# Patient Record
Sex: Male | Born: 1940
Health system: Southern US, Community
[De-identification: ages and names within clinical notes are randomized; demographics above are authoritative.]

## PROBLEM LIST (undated history)

## (undated) DIAGNOSIS — I429 Cardiomyopathy, unspecified: Secondary | ICD-10-CM

## (undated) DIAGNOSIS — I219 Acute myocardial infarction, unspecified: Secondary | ICD-10-CM

## (undated) DIAGNOSIS — I82409 Acute embolism and thrombosis of unspecified deep veins of unspecified lower extremity: Secondary | ICD-10-CM

## (undated) DIAGNOSIS — I2510 Atherosclerotic heart disease of native coronary artery without angina pectoris: Secondary | ICD-10-CM

## (undated) DIAGNOSIS — C8190 Hodgkin lymphoma, unspecified, unspecified site: Secondary | ICD-10-CM

## (undated) DIAGNOSIS — R001 Bradycardia, unspecified: Secondary | ICD-10-CM

## (undated) DIAGNOSIS — E785 Hyperlipidemia, unspecified: Secondary | ICD-10-CM

## (undated) HISTORY — DX: Hodgkin lymphoma, unspecified, unspecified site: C81.90

## (undated) HISTORY — PX: OTHER SURGICAL HISTORY: SHX169

## (undated) HISTORY — DX: Acute myocardial infarction, unspecified: I21.9

## (undated) HISTORY — DX: Cardiomyopathy, unspecified: I42.9

## (undated) HISTORY — DX: Atherosclerotic heart disease of native coronary artery without angina pectoris: I25.10

## (undated) HISTORY — DX: Hyperlipidemia, unspecified: E78.5

## (undated) HISTORY — DX: Acute embolism and thrombosis of unspecified deep veins of unspecified lower extremity: I82.409

## (undated) HISTORY — DX: Bradycardia, unspecified: R00.1

---

## 2004-12-21 DIAGNOSIS — I219 Acute myocardial infarction, unspecified: Secondary | ICD-10-CM

## 2004-12-21 HISTORY — DX: Acute myocardial infarction, unspecified: I21.9

## 2005-12-21 DIAGNOSIS — C8190 Hodgkin lymphoma, unspecified, unspecified site: Secondary | ICD-10-CM

## 2005-12-21 HISTORY — DX: Hodgkin lymphoma, unspecified, unspecified site: C81.90

## 2006-10-20 ENCOUNTER — Ambulatory Visit: Payer: Self-pay | Admitting: Cardiology

## 2006-11-24 ENCOUNTER — Ambulatory Visit: Payer: Self-pay | Admitting: Cardiology

## 2007-12-22 HISTORY — PX: CORONARY ANGIOPLASTY WITH STENT PLACEMENT: SHX49

## 2008-02-01 ENCOUNTER — Ambulatory Visit (HOSPITAL_COMMUNITY): Admission: RE | Admit: 2008-02-01 | Discharge: 2008-02-01 | Payer: Self-pay | Admitting: Internal Medicine

## 2008-02-03 ENCOUNTER — Ambulatory Visit: Payer: Self-pay | Admitting: Cardiology

## 2008-02-06 ENCOUNTER — Ambulatory Visit: Payer: Self-pay | Admitting: Cardiology

## 2008-02-07 ENCOUNTER — Ambulatory Visit: Payer: Self-pay | Admitting: Cardiovascular Disease

## 2008-02-07 ENCOUNTER — Inpatient Hospital Stay (HOSPITAL_BASED_OUTPATIENT_CLINIC_OR_DEPARTMENT_OTHER): Admission: RE | Admit: 2008-02-07 | Discharge: 2008-02-07 | Payer: Self-pay | Admitting: Cardiology

## 2008-02-10 ENCOUNTER — Observation Stay (HOSPITAL_COMMUNITY): Admission: RE | Admit: 2008-02-10 | Discharge: 2008-02-11 | Payer: Self-pay | Admitting: Cardiovascular Disease

## 2008-02-10 ENCOUNTER — Ambulatory Visit: Payer: Self-pay | Admitting: Cardiovascular Disease

## 2008-02-28 ENCOUNTER — Ambulatory Visit: Payer: Self-pay | Admitting: Cardiology

## 2008-03-29 ENCOUNTER — Ambulatory Visit: Payer: Self-pay | Admitting: Cardiology

## 2008-04-09 ENCOUNTER — Ambulatory Visit: Payer: Self-pay | Admitting: Cardiology

## 2008-05-09 ENCOUNTER — Ambulatory Visit: Payer: Self-pay | Admitting: Cardiology

## 2008-06-13 ENCOUNTER — Ambulatory Visit (HOSPITAL_COMMUNITY): Admission: RE | Admit: 2008-06-13 | Discharge: 2008-06-13 | Payer: Self-pay | Admitting: Internal Medicine

## 2008-06-14 ENCOUNTER — Encounter: Payer: Self-pay | Admitting: Cardiology

## 2008-07-12 ENCOUNTER — Ambulatory Visit: Payer: Self-pay | Admitting: Cardiology

## 2008-11-20 ENCOUNTER — Encounter: Payer: Self-pay | Admitting: Cardiology

## 2009-02-04 ENCOUNTER — Encounter: Payer: Self-pay | Admitting: Cardiology

## 2009-02-06 ENCOUNTER — Ambulatory Visit: Payer: Self-pay | Admitting: Cardiology

## 2009-02-06 DIAGNOSIS — E782 Mixed hyperlipidemia: Secondary | ICD-10-CM | POA: Insufficient documentation

## 2009-02-06 DIAGNOSIS — I2510 Atherosclerotic heart disease of native coronary artery without angina pectoris: Secondary | ICD-10-CM | POA: Insufficient documentation

## 2009-06-04 ENCOUNTER — Encounter: Payer: Self-pay | Admitting: Cardiology

## 2009-06-05 ENCOUNTER — Encounter: Payer: Self-pay | Admitting: Cardiology

## 2009-06-06 ENCOUNTER — Encounter: Payer: Self-pay | Admitting: Cardiology

## 2009-06-13 ENCOUNTER — Encounter: Payer: Self-pay | Admitting: Cardiology

## 2009-08-07 ENCOUNTER — Encounter (INDEPENDENT_AMBULATORY_CARE_PROVIDER_SITE_OTHER): Payer: Self-pay | Admitting: *Deleted

## 2009-08-09 ENCOUNTER — Encounter: Payer: Self-pay | Admitting: Physician Assistant

## 2009-08-09 ENCOUNTER — Ambulatory Visit: Payer: Self-pay | Admitting: Cardiology

## 2009-08-09 DIAGNOSIS — R42 Dizziness and giddiness: Secondary | ICD-10-CM | POA: Insufficient documentation

## 2009-08-09 DIAGNOSIS — R609 Edema, unspecified: Secondary | ICD-10-CM | POA: Insufficient documentation

## 2009-09-09 ENCOUNTER — Ambulatory Visit: Payer: Self-pay | Admitting: Cardiology

## 2009-09-09 ENCOUNTER — Encounter (INDEPENDENT_AMBULATORY_CARE_PROVIDER_SITE_OTHER): Payer: Self-pay | Admitting: *Deleted

## 2009-09-09 ENCOUNTER — Encounter: Payer: Self-pay | Admitting: Cardiology

## 2009-09-09 ENCOUNTER — Telehealth (INDEPENDENT_AMBULATORY_CARE_PROVIDER_SITE_OTHER): Payer: Self-pay | Admitting: *Deleted

## 2010-02-24 ENCOUNTER — Ambulatory Visit: Payer: Self-pay | Admitting: Cardiology

## 2010-02-24 DIAGNOSIS — I10 Essential (primary) hypertension: Secondary | ICD-10-CM | POA: Insufficient documentation

## 2010-02-26 ENCOUNTER — Encounter: Payer: Self-pay | Admitting: Cardiology

## 2010-02-27 ENCOUNTER — Encounter (INDEPENDENT_AMBULATORY_CARE_PROVIDER_SITE_OTHER): Payer: Self-pay | Admitting: *Deleted

## 2010-03-25 IMAGING — PT NM PET TUM IMG SKULL BASE T - THIGH
6 series · 25 of 25 positions shown · non-contrast
Comparison: 02/01/2008

CLINICAL DATA: Hodgkin's lymphoma.  Last chemotherapy 06/08/2008 .
Follow-up.

NUCLEAR MEDICINE WHOLE BODY PET/CT
TECHNIQUE: 15.1 mCi F-18 FDG was injected intravenously via the
left antecubital vein.  Full-ring PET imaging was performed from
the skull vertex through the lower extremities 65 minutes after
injection.  CT data was obtained and used for attenuation
correction and anatomic localization only.  (This was not acquired
as a diagnostic CT examination.)
Fasting Blood Glucose:  84

[Series 1: pet ac · axial · 3.3mm · 4.69mm/px · z∈[-873,-3]mm · 6 of 267 slices shown]
[im 1/267]
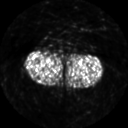
[im 54/267]
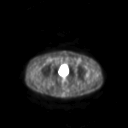
[im 107/267]
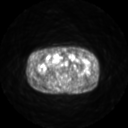
[im 160/267]
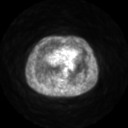
[im 213/267]
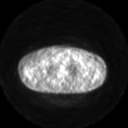
[im 267/267]
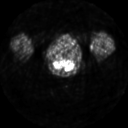

[Series 2: pet nac · axial · 3.3mm · 4.69mm/px · z∈[-873,-3]mm · 6 of 267 slices shown]
[im 1/267]
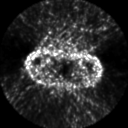
[im 54/267]
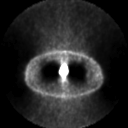
[im 107/267]
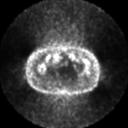
[im 160/267]
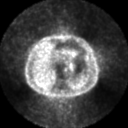
[im 213/267]
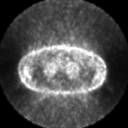
[im 267/267]
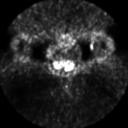

[Series 2: ct images · axial · 3.8mm · 0.98mm/px · z∈[-873,-4]mm · 6 of 267 slices shown]
[im 1/267]
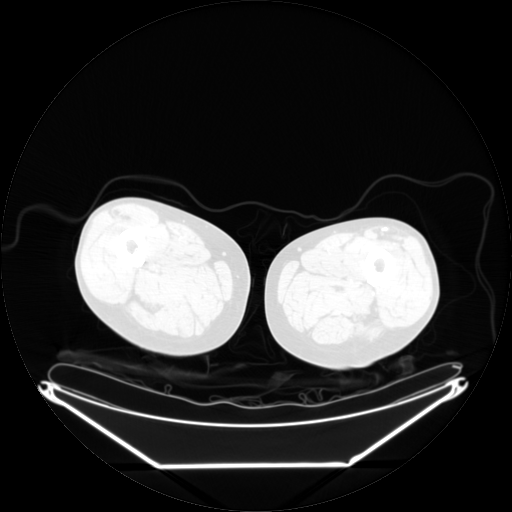
[im 54/267]
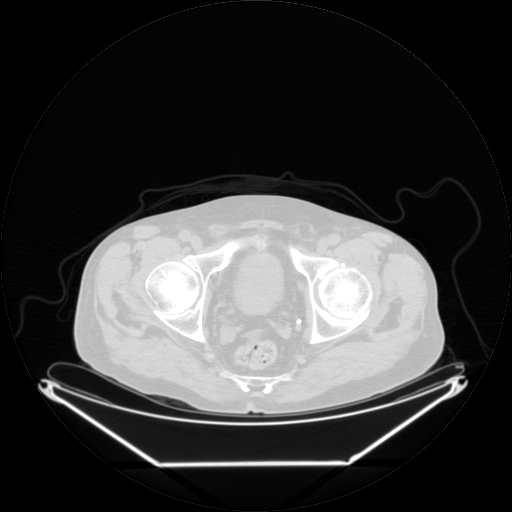
[im 107/267]
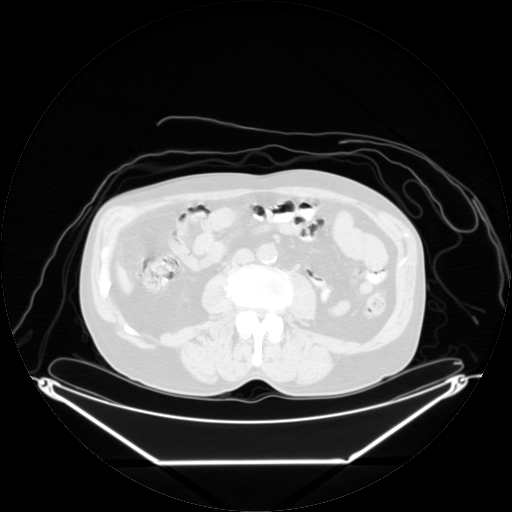
[im 160/267]
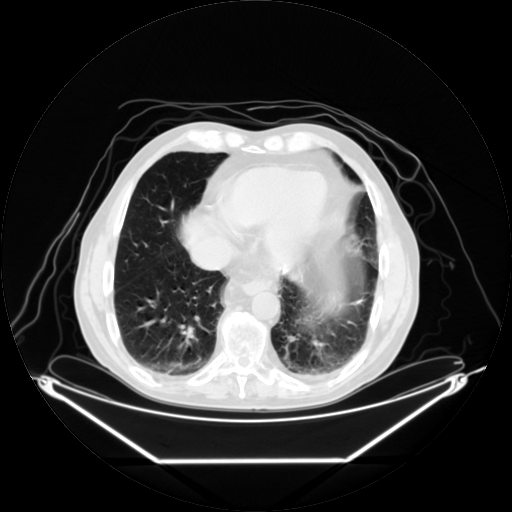
[im 213/267]
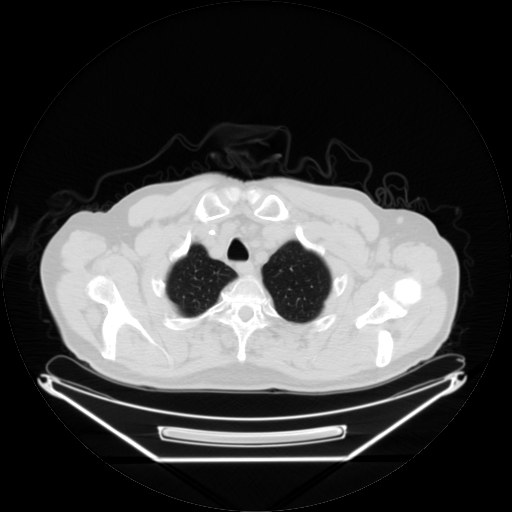
[im 267/267]
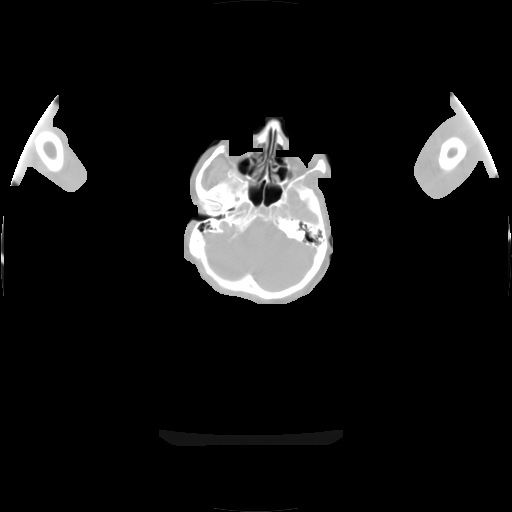

[Series 123: mip · coronal · 3.3mm · 4.69mm/px · 1 of 30 slices shown]
[im 1/30]
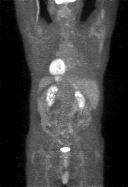

[Series 151: reformatted · axial · 3.3mm · 3.91mm/px · z∈[-762,-33]mm · 5 of 222 slices shown (1 of 2)]
[im 1/222]
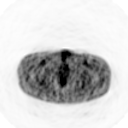
[im 56/222]
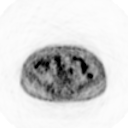
[im 111/222]
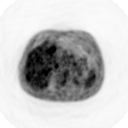
[im 166/222]
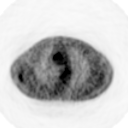
[im 222/222]
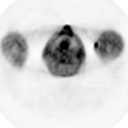

[Series 153: reformatted · coronal · 4.7mm · 6.98mm/px · 1 of 62 slices shown (2 of 2)]
[im 1/62]
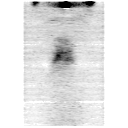

[25 of 25 positions shown; findings below may reference images not displayed]

FINDINGS: Significant interval improvement since prior examination.
For instance, right obturator/external iliac lymph node previously
demonstrated F D G uptake with maximal standard uptake value of
11.5 and now has a maximal standard uptake value of 1.7.  Several
other areas have improved since prior exam..

There are two areas which will require PET surveillance.  This
includes a small area of focal F D G uptake anterior to the
inferior vena cava immediately posterior to the second portion of
the duodenum where there is a tiny area of focal F D G uptake with
maximal standard uptake value of 3.5 but without underlying
discernible lymph node, see image 148.  Additionally, anterior to
the right common iliac artery adjacent to the small bowel loop may
be a small focal area of F D G uptake with maximal standard uptake
value of 4.4, see image 187.  This may represent partial volume
averaging/misregistration with adjacent small bowel uptake.

Subtle increased F D G uptake within the right lobe of thyroid
gland with maximal standard uptake value of 3.  Attention to this
region on follow-up.  No other worrisome areas of abnormal F D G
uptake detected.

Right Mediport catheter is in place and enters the distal left
brachiocephalic vein.

Prominent coronary artery calcifications.  Mild thoracic aortic
calcifications.  Mild abdominal aortic calcifications without
aneurysmal dilatation.  Common iliac artery calcifications.
Slightly prominent prostate gland.  Clinical laboratory correlation
recommended.  Under distended rectum with appearance of mild
circumferential thickening.  Scattered colonic diverticula.  Tiny
amount of free fluid in the pelvis.  Etiology indeterminate.  Low
density liver lesions appears similar to prior exam.  Renal
calcifications may be vascular in origin versus nonobstructing
renal calculi.  Small accessory splenic tissue.  Degenerative
changes cervical, thoracic and lumbar spine with spinal stenosis
lower lumbar region multifactorial in origin.  Dependent basilar
atelectatic changes.
IMPRESSION: Marked interval improvement with most areas demonstrating
resolution of previously noted abnormal F D G uptake.  There are
two tiny areas which may demonstrate malignant range F D G uptake
as discussed above however, it is possible both these findings
represent artifact/misregistration with adjacent bowel rather than
uptake within lymph nodes.  Attention to these regions on follow-up
as noted above.

Right Mediport catheters place with the tip entering the distal
left brachiocephalic vein.

Atherosclerotic type changes most prominent involving coronary
arteries.

Small amount of free fluid within the pelvis.  Etiology
indeterminate.

Slightly enlarged prostate gland.  Clinical and laboratory
correlation recommended.

Multifactorial spinal stenosis lower lumbar region.

## 2010-07-13 ENCOUNTER — Encounter: Payer: Self-pay | Admitting: Cardiology

## 2010-07-14 ENCOUNTER — Ambulatory Visit: Payer: Self-pay | Admitting: Cardiology

## 2010-07-14 ENCOUNTER — Encounter (INDEPENDENT_AMBULATORY_CARE_PROVIDER_SITE_OTHER): Payer: Self-pay | Admitting: *Deleted

## 2010-12-03 ENCOUNTER — Encounter: Payer: Self-pay | Admitting: Cardiology

## 2010-12-04 ENCOUNTER — Encounter: Payer: Self-pay | Admitting: Cardiology

## 2010-12-19 ENCOUNTER — Encounter (INDEPENDENT_AMBULATORY_CARE_PROVIDER_SITE_OTHER): Payer: Self-pay | Admitting: *Deleted

## 2010-12-25 ENCOUNTER — Encounter: Payer: Self-pay | Admitting: Cardiology

## 2011-01-05 ENCOUNTER — Encounter (INDEPENDENT_AMBULATORY_CARE_PROVIDER_SITE_OTHER): Payer: Self-pay | Admitting: *Deleted

## 2011-01-08 ENCOUNTER — Encounter: Payer: Self-pay | Admitting: Cardiology

## 2011-01-08 ENCOUNTER — Ambulatory Visit
Admission: RE | Admit: 2011-01-08 | Discharge: 2011-01-08 | Payer: Self-pay | Source: Home / Self Care | Attending: Cardiology | Admitting: Cardiology

## 2011-01-20 NOTE — Letter (Signed)
Summary: Lexiscan or Dobutamine Pharmacist, community at [facility]  [address]   [place], [place] [address]   Phone: [phone]  Fax: [phone]      [facility] Cardiovascular Services  Lexiscan or Dobutamine Cardiolite Strss Test    [place]  Appointment Date:_  Appointment Time:_  Your doctor has ordered a CARDIOLITE STRESS TEST using a medication to stimulate exercise so that you will not have to walk on the treadmill to determine the condition of your heart during stress. If you take blood pressure medication, ask your doctor if you should take it the day of your test. You should not have anything to eat or drink at least 4 hours before your test is scheduled, and no caffeine, including decaffeinated tea and coffee, chocolate, and soft drinks for 24 hours before your test.  You will need to register at the Outpatient/Main Entrance at the hospital 15 minutes before your appointment time. It is a good idea to bring a copy of your order with you. They will direct you to the Diagnostic Imaging (Radiology) Department.  You will be asked to undress from the waist up and given a hospital gown to wear, so dress comfortably from the waist down for example: Sweat pants, shorts, or skirt Rubber soled lace up shoes (tennis shoes)  Plan on about three hours from registration to release from the hospital  You may take all of your medications with water before your test. This test will be done before your office visit in 6 months (around January 2012).

## 2011-01-20 NOTE — Assessment & Plan Note (Signed)
Summary: 4 MOFU   Visit Type:  Follow-up Primary Provider:  Dr. Lia Hopping   History of Present Illness: 70 year old male presents for followup. He is here with his wife. He reports a recent "sinus infection." No interval problems with angina or unusual breathlessness. He reports compliance with medications.  Followup labs from March showed AST 22, ALT 24, cholesterol 108, triglycerides 70, HDL 38, LDL 56. We reviewed these.  Last ischemic workup was in 2009 as detailed below. We discussed followup surveillance testing prior to his next visit.  Preventive Screening-Counseling & Management  Alcohol-Tobacco     Smoking Status: quit     Year Quit: 1991  Current Medications (verified): 1)  Carvedilol 6.25 Mg Tabs (Carvedilol) .... Take One Tablet By Mouth Twice A Day 2)  Aspirin 81 Mg Tbec (Aspirin) .... Take One Tablet By Mouth Daily 3)  Simvastatin 40 Mg Tabs (Simvastatin) .... Take One Tablet By Mouth Daily At Bedtime 4)  Nitrostat 0.4 Mg Subl (Nitroglycerin) .... Place 1 Tablet Under Tongue As Directed 5)  Azithromycin 250 Mg Tabs (Azithromycin) .... Use As Directed 6)  Hydromet 5-1.5 Mg/69ml Syrp (Hydrocodone-Homatropine) .... Take 5ml By Mouth Every 4-6 Hours As Needed 7)  Mucinex 600 Mg Xr12h-Tab (Guaifenesin) .... Take 1 Tablet By Mouth Two Times A Day  Allergies: 1)  ! Benadryl  Comments:  Nurse/Medical Assistant: The patient's medication bottles and allergies were reviewed with the patient and were updated in the Medication and Allergy Lists.  Past History:  Past Medical History: Last updated: 02/24/2010 CAD - BMS RCA and circ 2/09, residual 75% LAD, LVEF 45% Hyperlipidemia Myocardial Infarction - out of hospital IMI Hodgkin's lymphoma Sinus bradycardia Left upper extremity deep venous thrombosis  Past Surgical History: Last updated: 02/06/2009 Port-A-Cath placement  Social History: Last updated: 02/24/2010 Married  Tobacco Use - Former Alcohol Use -  no  Review of Systems  The patient denies anorexia, fever, chest pain, syncope, dyspnea on exertion, peripheral edema, melena, and hematochezia.         Otherwise reviewed and negative except as outlined.  Vital Signs:  Patient profile:   70 year old male Height:      71 inches Weight:      183 pounds BMI:     25.62 Pulse rate:   57 / minute BP sitting:   125 / 76  (left arm) Cuff size:   regular  Vitals Entered By: Carlye Grippe (July 14, 2010 2:16 PM)  Nutrition Counseling: Patient's BMI is greater than 25 and therefore counseled on weight management options.  Physical Exam  Additional Exam:  Overweight male in no acute distress. HEENT: Conjunctiva and lids normal, oropharynx clear. Neck: Supple, no elevated jugular venous pressure or bruits. Lungs: Clear to auscultation, nonlabored, diminished breath sounds. Cardiac: Regular rate and rhythm, no S3. Abdomen: Soft, nontender, no bruits. Skin: Warm and dry. Extremities: No pitting edema, distal pulses one plus. Musculoskeletal: No gross deformities. Neuropsychiatric: Alert and oriented x3, affect appropriate.   Nuclear Study  Procedure date:  04/09/2008  Findings:      Adenosine/exercise Cardiolite without diagnostic ST segment changes. Inferior wall scar without residual ischemia, LVEF 53%.  Echocardiogram  Procedure date:  02/03/2008  Findings:      LVEF 40-45%, inferolateral hypokinesis, mild left atrial enlargement, mild mitral regurgitation, trace tricuspid regurgitation.  Cardiac Cath  Procedure date:  02/07/2008  Findings:      FINDINGS:  Aortic pressure 109/57 with a mean of 80, left ventricular   pressure  106/90.      CORONARY ANATOMY:   1. 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  The LAD is normal in its most proximal       segment.  There is a large first diagonal branch that supplies       multiple branches to the anterolateral wall.  In the  mid-LAD just       beyond the first diagonal branch there is an area of 80% stenosis.       There is a smaller second diagonal branch that arises from this       area.  After the stenotic lesion, the LAD has nonobstructive plaque       throughout its remaining course.  The LAD courses around the LV       apex.   2. 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  The left circumflex is a relatively small vessel.       It courses down and has a 90% lesion in in the proximal-to-       midportion of the vessel.  It there is no other significant disease       in the left circumflex.   3. The right coronary artery is subtotally occluded in the midportion.       There is a 99% stenosis in that area.  The distal branches of the       right coronary artery, including the posterolateral branches and       PDA branch, fill both via antegrade flow as well as left-to-right       collaterals.  There is no other significant stenosis throughout the       right coronary artery.   4. Left ventricular function.  The basal and mid inferior wall are       dyskinetic.  The remaining portions of the LV are normal.  The LVEF       is 45%.  There is no significant mitral regurgitation.   Impression & Recommendations:  Problem # 1:  CORONARY ATHEROSCLEROSIS NATIVE CORONARY ARTERY (ICD-414.01)  Symptomatically stable on medical therapy. A followup Lexiscan Cardiolite with low-level ambulation will be obtained on medical therapy prior to his next visit for ischemic surveillance, particularly to followup on his LAD disease. No medication changes made today.  His updated medication list for this problem includes:    Carvedilol 6.25 Mg Tabs (Carvedilol) .Marland Kitchen... Take one tablet by mouth twice a day    Aspirin 81 Mg Tbec (Aspirin) .Marland Kitchen... Take one tablet by mouth daily    Nitrostat 0.4 Mg Subl (Nitroglycerin) .Marland Kitchen... Place 1 tablet under tongue as directed  Problem # 2:  MIXED  HYPERLIPIDEMIA (ICD-272.2)  LDL well controlled as of last check in March. Followup lipid profile and liver function tests prior to next visit.  His updated medication list for this problem includes:    Simvastatin 40 Mg Tabs (Simvastatin) .Marland Kitchen... Take one tablet by mouth daily at bedtime  Problem # 3:  DIZZINESS (ICD-780.4)  Resolved.  Patient Instructions: 1)  Your physician has requested that you have an Best boy.  For further information please visit [email or web address]  Please follow instruction sheet, as given. DO THIS TEST BEFORE NEXT OFFICE VISIT IN 6 MONTHS. 2)  Your physician recommends that you go to the [facility] for a FASTING lipid profile  and liver function labs:  DO BEFORE NEXT OFFICE VISIT IN 6 MONTHS. Do not eat or drink after midnight. 3)  Your physician wants you to follow-up in: 6 months. You will receive a reminder letter in the mail one-two months in advance. If you don't receive a letter, please call our office to schedule the follow-up appointment.

## 2011-01-20 NOTE — Letter (Signed)
Summary: Engineer, materials at [facility]  [address]   [place], [place] [address]   Phone: [phone]  Fax: [phone]        February 27, 2010 MRN: [id number]    CORNELIUS MARULLO [address] [place], [place]  [address]    Dear Mr. Reha,  Your test ordered by Selena Batten has been reviewed by your physician (or physician assistant) and was found to be normal or stable. Your physician (or physician assistant) felt no changes were needed at this time.  ____ Echocardiogram  ____ Cardiac Stress Test  _X___ Lab Work-Per Dr. Diona Browner, LDL (bad cholesterol) is at goal and liver function looks good-continue same medications  ____ Peripheral vascular study of arms, legs or neck  ____ CT scan or X-ray  ____ Lung or Breathing test  ____ Other:   Thank you.   Cyril Loosen, RN, BSN    Duane Boston, M.D., F.A.C.C. Thressa Sheller, M.D., F.A.C.C. Oneal Grout, M.D., F.A.C.C. Cheree Ditto, M.D., F.A.C.C. Daiva Nakayama, M.D., F.A.C.C. Kenney Houseman, M.D., F.A.C.C. Jeanne Ivan, PA-C

## 2011-01-20 NOTE — Assessment & Plan Note (Signed)
Summary: 6 MO FU PER FEB REMINDER-SRS   Visit Type:  Follow-up Primary Provider:  Dr. Lia Hopping   History of Present Illness: 70 year old male presents for a followup visit. He reports doing reasonably well without any significant angina or progressive shortness of breath. He works 5 days a week, for a few hours a day, packing boxes at a [employer]  Adenosine Cardiolite done and followup back in April 2009 showed no clear evidence of ischemia with inferior wall scar, LVEF 53%.  He is tolerating simvastatin, and is due for a followup lipid profile and liver function tests.  Today we talked about his blood pressure, and the likelihood of needing to further uptitrate medical therapy. He would like to keep an eye on his blood pressure between now and his next visit prior to considering another medication.  Current Medications (verified): 1)  Carvedilol 6.25 Mg Tabs (Carvedilol) .... Take One Tablet By Mouth Twice A Day 2)  Aspirin 81 Mg Tbec (Aspirin) .... Take One Tablet By Mouth Daily 3)  Simvastatin 40 Mg Tabs (Simvastatin) .... Take One Tablet By Mouth Daily At Bedtime 4)  Nitrostat 0.4 Mg Subl (Nitroglycerin) .... Place 1 Tablet Under Tongue As Directed  Allergies (verified): 1)  ! Benadryl  Comments:  Nurse/Medical Assistant: The patient's medications and allergies were reviewed with the patient and were updated in the Medication and Allergy Lists. Bottles reviewed.  Past History:  Social History: Last updated: 02/24/2010 Married  Tobacco Use - Former Alcohol Use - no  Past Medical History: CAD - BMS RCA and circ 2/09, residual 75% LAD, LVEF 45% Hyperlipidemia Myocardial Infarction - out of hospital IMI Hodgkin's lymphoma Sinus bradycardia Left upper extremity deep venous thrombosis   Social History: Married  Tobacco Use - Former Alcohol Use - no  Review of Systems  The patient denies anorexia, fever, weight loss, chest pain, syncope, prolonged cough,  headaches, hemoptysis, melena, and hematochezia.         Otherwise reviewed and negative.  Vital Signs:  Patient profile:   70 year old male Height:      71 inches Weight:      182 pounds Pulse rate:   54 / minute BP sitting:   144 / 80  (right arm) Cuff size:   large  Vitals Entered By: Carlye Grippe (February 24, 2010 2:03 PM)  Physical Exam  Additional Exam:  Overweight male in no acute distress. HEENT: Conjunctiva and lids normal, oropharynx clear. Neck: Supple, no elevated jugular venous pressure or bruits. Lungs: Clear to auscultation, nonlabored, diminished breath sounds. Cardiac: Regular rate and rhythm, no S3. Abdomen: Soft, nontender, no bruits. Skin: Warm and dry. Extremities: No pitting edema, distal pulses one plus. Musculoskeletal: No gross deformities. Neuropsychiatric: Alert and oriented x3, affect appropriate.   EKG  Procedure date:  02/24/2010  Findings:      Sinus bradycardia at 51 beats per minute, lead loss in V6, evidence of previous inferior infarct.  Impression & Recommendations:  Problem # 1:  CORONARY ATHEROSCLEROSIS NATIVE CORONARY ARTERY (ICD-414.01)  Symptomatically stable on medical therapy. No active anginal symptoms with activities of daily living and part-time work. Electrocardiogram is stable. Refill for Coreg provided.  The following medications were removed from the medication list:    Nitroglycerin 0.4 Mg Subl (Nitroglycerin) ..... One tablet under tongue every 5 minutes as needed for chest pain---may repeat times three His updated medication list for this problem includes:    Carvedilol 6.25 Mg Tabs (Carvedilol) .Marland Kitchen... Take  one tablet by mouth twice a day    Aspirin 81 Mg Tbec (Aspirin) .Marland Kitchen... Take one tablet by mouth daily    Nitrostat 0.4 Mg Subl (Nitroglycerin) .Marland Kitchen... Place 1 tablet under tongue as directed  Orders: EKG w/ Interpretation (93000)  Problem # 2:  MIXED HYPERLIPIDEMIA (ICD-272.2)  Patient due for a followup fasting  lipid profile and liver function tests. These will be arranged.  The following medications were removed from the medication list:    Lipitor 80 Mg Tabs (Atorvastatin calcium) .Marland Kitchen... Take 1 tablet by mouth at bedtime His updated medication list for this problem includes:    Simvastatin 40 Mg Tabs (Simvastatin) .Marland Kitchen... Take one tablet by mouth daily at bedtime  The following medications were removed from the medication list:    Lipitor 80 Mg Tabs (Atorvastatin calcium) .Marland Kitchen... Take 1 tablet by mouth at bedtime His updated medication list for this problem includes:    Simvastatin 40 Mg Tabs (Simvastatin) .Marland Kitchen... Take one tablet by mouth daily at bedtime  Orders: T-Lipid Profile ([address]) T-Hepatic Function [phone])  Problem # 3:  ESSENTIAL HYPERTENSION, BENIGN (ICD-401.1)  Patient to watch blood pressure readings over the next few months. When he returns we will discuss possibilities for additional medical therapy if blood pressure is not more optimal. An ACE inhibitor would be a good choice.  His updated medication list for this problem includes:    Carvedilol 6.25 Mg Tabs (Carvedilol) .Marland Kitchen... Take one tablet by mouth twice a day    Aspirin 81 Mg Tbec (Aspirin) .Marland Kitchen... Take one tablet by mouth daily  Patient Instructions: 1)  Your physician recommends that you go to the [facility] for a FASTING lipid profile and liver function labs. Do not eat or drink after midnight.  2)  Refills sent to [facility] for Coreg (carvedilol) and Simvastatin. 3)  Your physician wants you to follow-up in: 3-4 months. You will receive a reminder letter in the mail one-two months in advance. If you don't receive a letter, please call our office to schedule the follow-up appointment. Prescriptions: SIMVASTATIN 40 MG TABS (SIMVASTATIN) Take one tablet by mouth daily at bedtime  #30 x 6   Entered by:   Cyril Loosen, RN, BSN   Authorized by:   Loreli Slot, MD, [facility]   Signed by:   Cyril Loosen, RN,  BSN on 02/24/2010   Method used:   Electronically to        [facility] (retail)       [address]       [place], [place]  [address]       Ph: [phone]       Fax: [phone]   RxID:   [id number] CARVEDILOL 6.25 MG TABS (CARVEDILOL) Take one tablet by mouth twice a day  #60 x 6   Entered by:   Cyril Loosen, RN, BSN   Authorized by:   Loreli Slot, MD, [facility]   Signed by:   Cyril Loosen, RN, BSN on 02/24/2010   Method used:   Electronically to        [facility] (retail)       [address]       [place], [place]  [address]       Ph: [phone]       Fax: [phone]   RxID:   [id number]

## 2011-01-22 NOTE — Miscellaneous (Signed)
Summary: Orders Update  Clinical Lists Changes  Orders: Added new Test order of T-Lipid Profile ([id number]) - Signed Added new Test order of T-Hepatic Function ([id number]) - Signed 

## 2011-01-22 NOTE — Letter (Signed)
Summary: External Correspondence/ [facility]  External Correspondence/ [facility]   Imported By: Dorise Hiss 01/08/2011 11:10:29  _____________________________________________________________________  External Attachment:    Type:   Image     Comment:   External Document

## 2011-01-22 NOTE — Assessment & Plan Note (Signed)
Summary: 6 mo fu per jan reminder   Visit Type:  Follow-up Primary Provider:  Dr. Lia Hopping   History of Present Illness: 70 year old Danny [name] presents for followup.  He continues to follow with oncology, and had a followup CT examination in December that showed no evidence of recurrent lymphoma. Lab work at that time showed BUN 19, creatinine 0.9, AST 24, ALT Danny, sodium 140, potassium 4.0, hemoglobin 15.0, platelets 182. Recent lipid profile showed cholesterol 111, HDL 44, LDL 54, triglycerides 63.  Followup ischemic testing was discussed at the last visit, not completed as yet. He is actually most comfortable with observation on medical therapy at this point. No active angina, no nitroglycerin use. Reports compliance with medications otherwise. He and his wife both continue to work.  Preventive Screening-Counseling & Management  Alcohol-Tobacco     Smoking Status: quit     Year Quit: 19 yrs  Current Medications (verified): 1)  Carvedilol 6.25 Mg Tabs (Carvedilol) .... Take One Tablet By Mouth Twice A Day 2)  Aspirin 81 Mg Tbec (Aspirin) .... Take One Tablet By Mouth Daily 3)  Simvastatin 40 Mg Tabs (Simvastatin) .... Take One Tablet By Mouth Daily At Bedtime 4)  Nitrostat 0.4 Mg Subl (Nitroglycerin) .... Place 1 Tablet Under Tongue As Directed  Allergies (verified): 1)  ! Benadryl  Past History:  Social History: Last updated: 02/24/2010 Married  Tobacco Use - Former Alcohol Use - no  Past Medical History: CAD - BMS RCA and circ 2/09, residual 75% LAD, LVEF 45% Hyperlipidemia Myocardial Infarction - out of hospital IMI Hodgkin's lymphoma, stage IIIB Sinus bradycardia Left upper extremity deep venous thrombosis  Review of Systems  The patient denies anorexia, fever, chest pain, syncope, dyspnea on exertion, peripheral edema, melena, and hematochezia.         Otherwise reviewed and negative.  Vital Signs:  Patient profile:   70 year old Danny [name] Height:      71  inches Weight:      184.25 pounds Pulse rate:   51 / minute BP sitting:   141 / 76  (left arm) Cuff size:   regular  Vitals Entered By: Hoover Brunette, LPN (January 08, 2011 2:28 PM) Is Patient Diabetic? No Comments 6 month f/u   Physical Exam  Additional Exam:  Overweight Danny [name] in no acute distress. HEENT: Conjunctiva and lids normal, oropharynx clear. Neck: Supple, no elevated jugular venous pressure or bruits. Lungs: Clear to auscultation, nonlabored, diminished breath sounds. Cardiac: Regular rate and rhythm, no S3. Abdomen: Soft, nontender, no bruits. Skin: Warm and dry. Extremities: No pitting edema, distal pulses one plus. Musculoskeletal: No gross deformities. Neuropsychiatric: Alert and oriented x3, affect appropriate.   EKG  Procedure date:  01/08/2011  Findings:      Sinus bradycardia at 49 beats per minute.  Impression & Recommendations:  Problem # 1:  CORONARY ATHEROSCLEROSIS NATIVE CORONARY ARTERY (ICD-414.01)  Symptomatically stable on medical therapy. ECG reviewed. We discussed the matter, and at this point he is most comfortable with observation on medical therapy. No formal ischemic testing at this point. I will see him back in 6 months.  His updated medication list for this problem includes:    Carvedilol 6.25 Mg Tabs (Carvedilol) .Marland Kitchen... Take one tablet by mouth twice a day    Aspirin 81 Mg Tbec (Aspirin) .Marland Kitchen... Take one tablet by mouth daily    Nitrostat 0.4 Mg Subl (Nitroglycerin) .Marland Kitchen... Place 1 tablet under tongue as directed  Orders: EKG w/ Interpretation (93000)  Problem #  2:  ESSENTIAL HYPERTENSION, BENIGN (ICD-401.1)  Blood pressure is somewhat elevated today. We discussed sodium restriction.  His updated medication list for this problem includes:    Carvedilol 6.25 Mg Tabs (Carvedilol) .Marland Kitchen... Take one tablet by mouth twice a day    Aspirin 81 Mg Tbec (Aspirin) .Marland Kitchen... Take one tablet by mouth daily  Orders: EKG w/ Interpretation  (93000)  Problem # 3:  MIXED HYPERLIPIDEMIA (ICD-272.2)  Lipids have been very well controlled, recent LDL 54.  His updated medication list for this problem includes:    Simvastatin 40 Mg Tabs (Simvastatin) .Marland Kitchen... Take one tablet by mouth daily at bedtime  Patient Instructions: 1)  Your physician wants you to follow-up in: 6 months. You will receive a reminder letter in the mail one-two months in advance. If you don't receive a letter, please call our office to schedule the follow-up appointment.  (July) 2)  Your physician recommends that you continue on your current medications as directed. Please refer to the Current Medication list given to you today. Prescriptions: NITROSTAT 0.4 MG SUBL (NITROGLYCERIN) Place 1 tablet under tongue as directed  #25 x 3   Entered by:   Cyril Loosen, RN, BSN   Authorized by:   Loreli Slot, MD, [facility]   Signed by:   Cyril Loosen, RN, BSN on 01/08/2011   Method used:   Electronically to        [facility] (retail)       [address]       [place], [place]  [address]       Ph: [phone]       Fax: [phone]   RxID:   [id number]

## 2011-01-22 NOTE — Letter (Signed)
Summary: Engineer, materials at [facility]  [address]   [place], [place] [address]   Phone: [phone]  Fax: [phone]        January 05, 2011 MRN: [id number]   Danny [name] [address] [place], [place]  [address]   Dear Danny [name],  Your test ordered by Selena Batten has been reviewed by your physician (or physician assistant) and was found to be normal or stable. Your physician (or physician assistant) felt no changes were needed at this time.  ____ Echocardiogram  ____ Cardiac Stress Test  __X__ Lab Work   ____ Peripheral vascular study of arms, legs or neck  ____ CT scan or X-ray  ____ Lung or Breathing test  ____ Other:   Thank you.   Hoover Brunette, LPN    Duane Boston, M.D., F.A.C.C. Thressa Sheller, M.D., F.A.C.C. Oneal Grout, M.D., F.A.C.C. Cheree Ditto, M.D., F.A.C.C. Daiva Nakayama, M.D., F.A.C.C. Kenney Houseman, M.D., F.A.C.C. Jeanne Ivan, PA-C

## 2011-04-01 ENCOUNTER — Other Ambulatory Visit: Payer: Self-pay | Admitting: Cardiology

## 2011-04-01 ENCOUNTER — Other Ambulatory Visit: Payer: Self-pay | Admitting: *Deleted

## 2011-04-01 MED ORDER — SIMVASTATIN 40 MG PO TABS: 40.0000 mg | ORAL_TABLET | Freq: Every evening | ORAL | Status: DC

## 2011-04-01 MED ORDER — CARVEDILOL 6.25 MG PO TABS: 6.2500 mg | ORAL_TABLET | Freq: Two times a day (BID) | ORAL | Status: DC

## 2011-04-02 ENCOUNTER — Other Ambulatory Visit: Payer: Self-pay

## 2011-05-05 NOTE — Assessment & Plan Note (Signed)
[facility]                          [facility] OFFICE NOTE   Danny [name], Danny [name]                      MRN:          [id number]  DATE:03/29/2008                            DOB:          January 05, 1941    ONCOLOGIST:  Dr. Ubaldo Glassing.   PRIMARY CARE PHYSICIAN:  Dr. Olena Leatherwood   REASON FOR VISIT:  Cardiac follow-up.   HISTORY OF PRESENT ILLNESS:  I saw Danny [name] back in March.  His  history is detailed on my previous note.  He continues with chemotherapy  with a diagnosis of Hodgkin's lymphoma.  He has had an occasional  tingling in his chest, but no frank angina.  He otherwise continues on  the medications outlined below.  His electrocardiogram shows sinus  bradycardia with evidence of an inferior infarct pattern which is old.  He has not yet returned to work and was wondering about whether he would  be able to do this later in the month.  He does do some manual labor,  but lifts no more than 50 pounds typically.  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  In addition to this, he had  75% mid left anterior descending stenosis.  He was treated with bare  metal stents to the right coronary artery, as well as anomalous  circumflex and completed a month of Plavix, continuing on aspirin now.  His left anterior descending was treated medically.   ALLERGIES:  NO KNOWN DRUG ALLERGIES.   MEDICATIONS:  1. Aspirin 325 mg p.o. daily.  2. Carvedilol 12.5 mg p.o. b.i.d.  3. Lipitor 80 mg p.o. nightly.  4. He is not on ACE inhibitor therapy given relatively low blood      pressure.   REVIEW OF SYSTEMS:  As described in history of present illness.  No  spontaneous  bleeding or ecchymosis.   PHYSICAL EXAMINATION:  VITAL SIGNS:  Blood pressure today is 131/78,  heart rate is 58, weight 170.8 pounds.  Oxygen saturation is 97% on room  air.  GENERAL:  The patient is comfortable in no acute distress.  HEENT:  Conjunctivae normal.  Oropharynx is clear.  NECK:  Supple.  No elevated jugulovenous pressure.  No loud bruits.  No  thyromegaly.  LUNGS:  Clear without labored breathing.  CARDIAC:  Reveals a regular rate and rhythm.  No S3 gallop or  pericardial rub.  ABDOMEN:  Soft, nontender.  EXTREMITIES:  Exhibit no frank pitting edema.  Distal pulses of 2+.  MUSCULOSKELETAL:  No kyphosis noted.  NEUROPSYCHIATRIC:  The patient is alert and oriented x3.   IMPRESSION/RECOMMENDATIONS:  Known cardiovascular disease as outlined  above with an ejection fraction of approximately 45%, associated with  inferior wall motion abnormality.  He underwent placement of bare metal  stents to the right coronary artery and left  circumflex as outlined and  has a residual moderate stenosis of the left anterior descending.  I  discussed this with him today and in regards to his potential return to  work from a cardiovascular perspective, I think the most reasonable  thing to do would to add Imdur to his present regimen and schedule an  adenosine Cardiolite and follow-up to assess for any residual ischemia.  We can then make a more objective termination of his functional capacity  in anticipation for return to work.  Other than this, he also plans to  discuss his cancer treatment as that is also potentially related to his  ability to return to work.  He will discuss this further with Dr.  Ubaldo Glassing.  Otherwise, I will plan to see him back over the next month.     Danny Sidle, MD  Electronically Signed    SGM/MedQ  DD: 03/29/2008  DT: 03/29/2008  Job #: [id number]   cc:   Danny [name]  Danny [name], M.D.

## 2011-05-05 NOTE — Discharge Summary (Signed)
NAME:  Danny [name], MCKIVER NO.:  [id number]   MEDICAL RECORD NO.:  [id number]          PATIENT TYPE:  INP   LOCATION:  6525                         FACILITY:  [facility]   PHYSICIAN:  Gerrit Friends. Dietrich Pates, MD, FACCDATE OF BIRTH:  [birth year]   DATE OF ADMISSION:  02/10/2008  DATE OF DISCHARGE:  02/11/2008                               DISCHARGE SUMMARY   PROCEDURES:  1. Cardiac catheterization.  2. PTCA and bare-metal stent to two vessels.   PRIMARY FINAL DISCHARGE DIAGNOSIS:  Unstable anginal pain with an out-of-  hospital myocardial infarction.   SECONDARY DIAGNOSES:  1. Ischemic cardiomyopathy with an ejection fraction of 45-50% by      catheterization February 07, 2008.  2. Residual coronary artery disease of 80% in the left anterior      descending artery, to be treated medically.  3. Hodgkin's lymphoma.  4. Status post Port-A-Cath for chemotherapy.  5. Hypertension.  6. Unknown lipid status, profile pending at the time of dictation.  7. Family history of coronary artery disease.  8. Chronic obstructive pulmonary disease.  9. Remote history of tobacco use.  10.History of sinus bradycardia with a heart rate in the 50s,      especially when asleep.   TIME AT DISCHARGE:  35 minutes.   HOSPITAL COURSE:  [name] is a 70 year old male with previous  history of nonobstructive coronary artery disease by catheterization  approximately 10 years ago at [facility].  He went to [facility]  for placement of a Port-A-Cath.  He told the anesthesiologist about  chest pain that had started the night of his lymph node biopsy.  His EKG  had new inferior Q-waves and cardiology was asked to evaluate him.   Because his EKG was abnormal in a new way he was set up for cardiac  catheterization which was performed on February 07, 2008.  The cardiac  catheterization showed a 99% RCA and a 90% anomalous circumflex with an  80% LAD and left-to-right collaterals.  His EF was  45-50%.  Dr. Excell Seltzer  reviewed the films and felt that percutaneous intervention on the RCA  and circumflex was the best option and he was brought back in for this  on February 10, 2008.   [name] had bare-metal stents placed to the RCA and the circumflex,  reducing the RCA stenosis from 99 to zero with TIMI 3 flow and the  anomalous circumflex from 90% to zero with TIMI 3 flow.  Dr. Excell Seltzer felt  that he should be on aspirin and Plavix for at least 30 days with  aggressive lipid-lowering therapy and heart-healthy lifestyle  encouraged.  At this time, medical therapy for the LAD disease is  indicated.  He is cleared to start chemotherapy.   The next day his catheterization site, which had had some oozing  overnight, was without hematoma or bruit.  He had had diarrhea prior to  admission but this had resolved.  His heart rate was stable with sinus  bradycardia occasionally in the 50s, especially when asleep, but nothing  sustained below 50.  His other labs  were within normal limits except for  a white count slightly elevated at 13.1.  His postprocedure CK-MB was  within normal limits at 48/3.1.  He is pending evaluation by Dr.  Dietrich Pates but tentatively considered stable for discharge with outpatient  follow-up arranged.   DISCHARGE INSTRUCTIONS:  1. His activity level is to be increased gradually.  2. He is not to do any lifting for 2 weeks and no driving for 2 days.  3. He is to stick to a low-sodium heart-healthy diet.  4. He is to follow up with Dr. Diona Browner and Tereso Newcomer and a message      has been left with the [facility] office.  5. He is to follow up with Dr. Olena Leatherwood and Dr. Ubaldo Glassing as scheduled.   DISCHARGE MEDICATIONS:  1. Plavix 75 mg daily for at least 30 days.  2. Coated aspirin 325 mg daily.  3. Nitroglycerin sublingual p.r.n.  4. Lipitor 80 mg a day.  5. Norvasc 2.5 mg a day.  6. Coreg 3.125 mg b.i.d.  7. Lisinopril 10 mg a day.  8. Amitriptyline 50 mg daily as at  home.  9. Cimetidine 400 mg daily.  10.Seroquel 100 mg daily.  11.Hydroxyzine 25 mg b.i.d.      Theodore Demark, PA-C      Gerrit Friends. Dietrich Pates, MD, [facility]  Electronically Signed    RB/MEDQ  D:  02/11/2008  T:  02/12/2008  Job:  [id number]   cc:   [facility] [place], [place]  [place]. Darovsky, M.D.

## 2011-05-05 NOTE — Assessment & Plan Note (Signed)
[facility]                          [facility] OFFICE NOTE   ANOTHY, BUFANO                      MRN:          [id number]  DATE:07/12/2008                            DOB:          March 27, 1941    PRIMARY CARDIOLOGIST:  Jonelle Sidle, MD.   REASON FOR VISIT:  Scheduled followup.   Danny [name] returns to our clinic for scheduled followup.  Since last  seen here in May, he continues to do well from a cardiovascular  standpoint with no interim development of signs/symptoms suggestive of  unstable angina pectoris or decompensated heart failure.  He has,  however, since been diagnosed with left upper extremity DVT.  This was  confirmed by an upper extremity duplex scan, ordered by Dr. Ubaldo Glassing,  after Danny [name] presented with a complaint of left neck discomfort.  He states that he had never had any pain until the evening after having  completed his PET scan.  The ultrasound was positive for evidence of  thrombus in both the left jugular veins as well as the mid left  subclavian vein.  Dr. Ubaldo Glassing subsequently started Danny [name] on  Coumadin, and is monitoring and managing this.   With respect to the recent PET scan, Danny [name] informs me that this  was a good report.  Danny [name] is being treated for Hodgkin lymphoma,  diagnosed earlier this year.  He is near completion of a recommended  course of chemotherapy.   CURRENT MEDICATIONS:  Aspirin 325 daily; Lipitor 40 daily; carvedilol  12.5 b.i.d.; Coumadin 5 mg, as directed per Dr. Ubaldo Glassing; Decadron;  bleomycin; vinblastine; and Carbazine.   PHYSICAL EXAMINATION:  VITAL SIGNS:  Blood pressure 118/67, pulse 66 and  regular, and weight 173.8 (up 3).  GENERAL:  A 70 year old male, sitting upright, no distress.  HEENT:  Normocephalic and atraumatic.  NECK:  Palpable carotid pulse without bruits; no JVD.  LUNGS:  Clear to auscultation in all fields.  HEART:  Regular rate and rhythm  (S1S2).  No significant murmurs.  No  rubs or gallops.  ABDOMEN:  Soft, nontender.  EXTREMITIES:  Minimally palpable posterior tibialis pulses with trace up  to 1+ bilateral pedal edema.  Mild venous varicosity noted.  NEURO:  No focal deficit.   IMPRESSION:  1. Multivessel coronary artery disease      a.     Status post out of hospital inferior myocardial infarction,       subsequent bare-metal stenting of subtotal mid right coronary       artery and circumflex arteries.      b.     Residual 75% mid left anterior descending stenosis, treated       medically.      c.     Ejection fraction 45%.      d.     Non-ischemic adenosine Cardiolite; ejection fraction 53%,       April 2009.  2. Hodgkin lymphoma.      a.     Followed by Dr. Ubaldo Glassing.  3. Left upper extremity deep venous thrombosis with thrombus.  a.     On Coumadin, followed by Dr. Ubaldo Glassing.  4. Dyslipidemia.  5. Mild, chronic lower extremity edema.      a.     Suspect secondary to chronic venous insufficiency.  6. Chronic obstructive pulmonary disease/remote tobacco.  7. History of sinus bradycardia.   PLAN:  1. Down titrate aspirin to 81 mg daily, in light of recent addition of      Coumadin.  The patient should then remain on this low dose of      aspirin indefinitely.  2. Followup FLP/LFTs next month.  This is in light of recent down      titration of Lipitor from 80 to 40, following review of most recent      lipid profile this past May, which yielded an LDL of 39 and a      cholesterol of 90.  3. Schedule return clinic followup with myself and Dr. Diona Browner in 6      months.      Rozell Searing, PA-C  Electronically Signed      Jonelle Sidle, MD  Electronically Signed   GS/MedQ  DD: 07/12/2008  DT: 07/13/2008  Job #: [id number]   cc:   Lebron Conners. Darovsky, M.D.  Lia Hopping

## 2011-05-05 NOTE — Assessment & Plan Note (Signed)
[facility]                          [facility] OFFICE NOTE   CLARKE, PERETZ                      MRN:          [id number]  DATE:05/09/2008                            DOB:          28-Jun-1941    PRIMARY CARE PHYSICIAN:  Dr. Olena Leatherwood.   ONCOLOGIST:  Dr. Ubaldo Glassing.   REASON FOR VISIT:  Cardiac followup.   HISTORY OF PRESENT ILLNESS:  Mr. Veley is a very pleasant 70 year old  gentleman I last saw in the office back in April.  He has known  cardiovascular disease status post inferior wall myocardial infarction  with an ejection fraction of 45%, status post bare metal stent placement  to both the right coronary artery and an anomalous circumflex with  moderate left anterior descending disease.  This is being managed  medically.  I referred him for a followup Myoview on medical therapy  which was performed back in April.  This study demonstrated evidence of  inferior wall scar without frank residual ischemia and overall ejection  fraction of 53%.  Overall, he is doing fairly well.  He is back to work  approximately 4 hours at a time and also doing some yard work.  He  continues undergoing regular chemotherapy under the direction of Dr.  Ubaldo Glassing.  His blood pressure has trending up some, although we have not  yet started an ACE inhibitor.  In the past, he was having trouble with  relative hypotension.  He otherwise has been tolerating his medications  well.  He did not start the Imdur, which we discussed last time,  although frankly he is not reporting any major limiting angina and I  told him he could hold off for the time being.   ALLERGIES:  No known drug allergies.   PRESENT MEDICATIONS:  1. Aspirin 325 mg p.o. daily.  2. Lipitor 80 mg p.o. nightly.  3. Carvedilol 12.5 mg p.o. b.i.d.  4. Sublingual 0.4 mg p.r.n.   REVIEW OF SYSTEMS:  As descried in the history of present illness.  Otherwise negative.   EXAMINATION:  Blood pressure  122/67, heart rate is 65, weight is 170.8  pounds.  The patient is comfortable in no acute distress.  Examination neck reveals no elevated jugular venous pressure no loud  bruits.  Lungs are clear without labored breathing.  CARDIAC:  Exam was a regular rate and rhythm.  No loud murmur or gallop.  EXTREMITIES:  Exhibit no pitting edema.   IMPRESSION/RECOMMENDATIONS:  Coronary artery disease with an ejection  fraction of 45-50% status post previous inferior wall myocardial  infarction status post bare metal stent placement to the right coronary  artery and anomalous circumflex with medically managed moderate left  anterior descending disease.  The patient is overall doing well on  medical therapy.  I told him he could hold off on Imdur for the time  being unless he develops exertional symptoms.  He will continue his  chemotherapy as planned and we will plan to see him back over the next 3  months.  I think if his blood pressure remains consistently in a normal  to mildly elevated range, we might institute a low-dose ACE inhibitor.     Jonelle Sidle, MD  Electronically Signed    SGM/MedQ  DD: 05/09/2008  DT: 05/09/2008  Job #: [id number]   cc:   Lia Hopping  Boris Judie Petit. Darovsky, M.D.

## 2011-05-05 NOTE — Discharge Summary (Signed)
NAME:  Danny [name], Danny [name] NO.:  [id number]   MEDICAL RECORD NO.:  [id number]          PATIENT TYPE:  INP   LOCATION:  6525                         FACILITY:  [facility]   PHYSICIAN:  Gerrit Friends. Dietrich Pates, MD, FACCDATE OF BIRTH:  09/22/1941   DATE OF ADMISSION:  02/10/2008  DATE OF DISCHARGE:  02/11/2008                               DISCHARGE SUMMARY   ADDENDUM:  After evaluating [name], Dr. Dietrich Pates made several  medication changes.  He felt that [name] should be on a higher dose  of Coreg, and, therefore, the Coreg was changed from 3.125 mg b.i.d. to  12.5 mg b.i.d..  He felt that the Norvasc could be discontinued to allow  an increase in the beta blocker.  All other medications are the same.      Theodore Demark, PA-C      Gerrit Friends. Dietrich Pates, MD, [facility]  Electronically Signed    RB/MEDQ  D:  02/11/2008  T:  02/12/2008  Job:  [id number]

## 2011-05-05 NOTE — Assessment & Plan Note (Signed)
[facility]                          [facility] NOTE   Danny [name], Danny [name]                      MRN:          [id number]  DATE:02/28/2008                            DOB:          [birth year]    PRIMARY CARE PHYSICIAN:  Dr. Olena Leatherwood.   ONCOLOGIST:  Dr. Ubaldo Glassing.   REASON FOR VISIT:  Cardiac followup.   HISTORY OF PRESENT ILLNESS:  I last saw Danny [name] back in December  2007.  At that point we were following him with a history of  nonobstructive coronary artery disease and asymptomatic bradycardia.  Within the last month he was diagnosed with Hodgkin's lymphoma and was  referred for evaluation of Port-A-Cath placement in anticipation of  chemotherapy.  It was determined at the time of that evaluation, the  patient had had symptoms suggestive of recent myocardial infarction.  He  was seen in consultation by Dr. Andee Lineman and referred for a diagnostic  cardiac catheterization which revealed multivessel obstructive coronary  artery disease.  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  The patient ultimately underwent placement of 2 bare  metal stents by Dr. Excell Seltzer addressing both the right coronary artery and  left circumflex stenoses with planned Plavix for 30 days in anticipation  of the patient's chemotherapy.  He states that he has had a chemotherapy  session already back on February 26 and is due for blood work with a  repeat session this Friday with Dr. Ubaldo Glassing.  He had no major bleeding  problems.  He does state he had some transient hematochezia recently  associated with constipation and straining but none over the last few  days.  He had no anginal chest pain or progressive breathlessness.  Electrocardiogram shows sinus rhythm with evidence of previous inferior  wall infarct and nonspecific ST-T wave changes.  Today I reviewed his  medications.   ALLERGIES:  No known drug allergies.   MEDICATIONS:  1. Aspirin 325 mg p.o. daily.  2. Plavix 75 mg p.o. daily.  3. Carvedilol 12.5 mg p.o. b.i.d.  4. Lipitor 80 mg p.o. nightly.  5. Sublingual nitroglycerin 0.4 mg p.r.n.   REVIEW OF SYSTEMS:  As in history present illness.  He had some lower  extremity edema, around the ankles.  He has been trying to increase  protein in his diet and he had no problems with orthopnea or PND.   EXAMINATION:  Blood pressure is 128/77, heart rate is 74.  Weight is 167  pounds.  This a thin male overall normally nourished appearing no acute distress.  Conjunctivae lids normal.  Oropharynx clear.  NECK:  Supple.  No elevated jugular venous pressure or loud bruits.  No  thyromegaly was noted.  LUNGS:  Clear without labored breathing at rest.  CARDIAC:  Regular rate and rhythm.  No loud murmur or gallop.  No  pericardial rub.  ABDOMEN:  Obese  and nontender.  Normoactive bowel sounds.  EXTREMITIES:  Showed trace to 1+ edema, around the ankle some venous  stasis.  Distal pulses are 2+.  SKIN:  Warm and dry.  Muscular no kyphosis noted.  Neuropsychiatric the patient is alert x3.  Affect is appropriate   IMPRESSION/RECOMMENDATIONS:  Recently diagnosed obstructive coronary  disease status post apparent out of hospital inferior wall myocardial  infarction with an ejection fraction of 45% and inferior wall akinesis.  The patient is now status post bare metal stent placement to a  subtotally occluded right coronary artery as well as anomalous left  circumflex by Dr. Excell Seltzer over the last month.  He tolerated this well  and has moderate residual disease within the left anterior descending  without active angina.  The plan was for aspirin and Plavix for least 30  days.  I would anticipate he should be  able to stop this medicine over  the next week, particularly in light of his ongoing chemotherapy.  If  his aspirin needs to be held as well, this is also reasonable, although  would try to continue it if possible hematologically.  We will plan to  see him over the next few months and at that time check a follow-up  after the profile, liver function tests on Lipitor.  We talked about  possibly using a diuretic intermittently for his lower extremity edema,  although I am a bit hesitant to do this and potentially precipitate  relative dehydration around the time of chemotherapy.  We will hold off  time being.     Jonelle Sidle, MD  Electronically Signed    SGM/MedQ  DD: 02/28/2008  DT: 02/28/2008  Job #: [id number]   cc:   Lia Hopping  Boris Judie Petit. Darovsky, M.D.

## 2011-05-05 NOTE — Assessment & Plan Note (Signed)
[facility]                          [facility] NOTE   Danny [name], Danny [name]                      MRN:          [id number]  DATE:02/06/2009                            DOB:          01-24-1941    PRIMARY CARE PHYSICIAN:  Dr. Lia [name]   ONCOLOGIST:  Danny Conners. Darovsky, MD   REASON FOR VISIT:  Routine followup.   HISTORY OF PRESENT ILLNESS:  Danny [name] comes back in for a 50-month  visit.  He is doing well and states that at this point his Hodgkin  lymphoma is in remission.  He still has a Port-A-Cath in place and has a  followup scan in about 6 months.  From a cardiac perspective, he is not  reporting any angina.  He has not had to use any nitroglycerin and in  fact needs a refill because his present bottle is old.  He had followup  lipids done recently showing good LDL control at 52 and an HDL of 42  with normal SGPT and SGOT.  He is on statin therapy and tolerating it  well.  I reviewed his medications which have been simplified  significantly since his last visit.  He is no longer on Coumadin having  undergone treatment for left upper extremity deep venous thrombosis by  Dr. Ubaldo [name].  He has occasional episodes of lightheadedness and nausea,  although he has had no syncope.  His heart rate at rest is low on the  present dose of carvedilol.   ALLERGIES:  No known drug allergies.   PRESENT MEDICATIONS:  1. Aspirin 81 mg p.o. daily.  2. Carvedilol 12.5 mg p.o. b.i.d.  3. Lipitor 40 mg p.o. nightly.  4. Sublingual nitroglycerin 0.4 mg p.r.n.   REVIEW OF SYSTEMS:  As described in the history of present illness.  Otherwise, negative.   PHYSICAL EXAMINATION:  VITAL SIGNS:  Blood pressure today is 132/65,  heart rate is 53, weight is 173 pounds.  GENERAL:  The patient is comfortable in no acute distress.  HEENT:  Conjunctiva and lids normal.  Oropharynx clear.  NECK:  Supple.  No elevated jugular venous pressure.  No loud bruits.  No thyromegaly is noted.  LUNGS:  Clear with diminished breath sounds.  CARDIAC:  Regular rate and rhythm.  No S3 gallop or pericardial rub.  ABDOMEN:  Soft, nontender.  EXTREMITIES:  Mild venous stasis, trace edema around the ankles.  MUSCULOSKELETAL:  No kyphosis noted.  NEUROPSYCHIATRIC:  The patient is alert and oriented x3.  Affect is  appropriate   IMPRESSION AND RECOMMENDATIONS:  1. Coronary artery disease status post out of hospital inferior wall      myocardial infarction subsequently managed with bare-metal stent      placement to the mid right coronary artery and circumflex vessels.      Ejection fraction is in the 45-50% range and he does have residual      75% left anterior descending disease that is being managed      medically following a nonischemic Cardiolite.  He is      symptomatically stable.  We  will plan to continue medical therapy      with followup in the next 6 months.  2. Hyperlipidemia, will be managed on present dose of Lipitor.  3. History of Hodgkin lymphoma, reportedly in remission at this point.      This is followed by Dr. Ubaldo [name].  4. Occasional episodes of lightheadedness and nausea, of uncertain      significance.  He is not reporting any chest pain or palpitations      with this.  He does have a low resting heart rate raising the      possibility of intermittent symptomatic bradycardia.  We will plan      to decrease his carvedilol to 6.25 mg p.o. b.i.d. and follow him      expectantly.     Danny Sidle, MD  Electronically Signed    SGM/MedQ  DD: 02/06/2009  DT: 02/07/2009  Job #: [id number]   cc:   Danny [name], M.D.

## 2011-05-05 NOTE — Cardiovascular Report (Signed)
NAME:  Danny [name], Danny [name] NO.:  [id number]   MEDICAL RECORD NO.:  [id number]          PATIENT TYPE:  INP   LOCATION:  2807                         FACILITY:  [facility]   PHYSICIAN:  Veverly Fells. Excell Seltzer, MD  DATE OF BIRTH:  01/18/1941   DATE OF PROCEDURE:  02/10/2008  DATE OF DISCHARGE:                            CARDIAC CATHETERIZATION   PROCEDURE:  PTCA and stenting of the right coronary artery and left  circumflex, Angio-Seal of the right femoral artery.   INDICATIONS:  Danny [name] is a 70 year old gentleman who sustained a  recent inferior wall myocardial infarction out of the hospital.  He  underwent a diagnostic cath earlier this week which was approximately 2  weeks following his clinical event. This demonstrated a subtotally  occluded right coronary artery with a 99% lesion in the mid vessel and  TIMI II flow.  There were collaterals identified from the left coronary  as well as antegrade flow through the right coronary artery.  In  addition he has an anomalous left circumflex that originates from the  proximal right coronary artery that demonstrated a 90-95% stenosis.  He  also had moderate LAD disease of 75% in the mid-LAD just at the origin  of the diagonal branch.  After reviewing his films with both Dr. Riley Kill  and Dr. Juanda Chance, I elected to intervene on the right coronary artery and  left circumflex and treat his LAD with medical therapy.  This is in the  setting of newly diagnosed Hodgkin's lymphoma and need for chemotherapy.  I planned on treating him with bare metal stents so that he will not be  relegated to long-term Plavix.   The risks and indications of the procedure were reviewed with the  patient, informed consent was obtained. The right groin was prepped,  draped, anesthetized with 1% lidocaine. Using the modified Seldinger  technique, a 6-French sheath was placed in the right femoral artery. A 6-  [place] AR-1 guide catheter was used and this fit  well into the vessel  which had a very inferior takeoff. The initial angiography was  performed.  The patient had been preloaded on clopidogrel.  Angiomax was  used for anticoagulation.  Once a therapeutic ACT was achieved, a cougar  guidewire was used to wire the mid-right coronary artery lesion and this  was done with a moderate amount of difficulty.  The lesion was then  predilated with a 2.0 x 15-mm Vision stent which was taken to 10  atmospheres.  Following balloon dilatation, the antegrade flow in the  vessel improved dramatically. The lesion appeared to be approximately 12-  14 mm in length.  It appeared to be 3 mm in diameter and I elected to  treat it with a 3.0 x 16-mm Liberte' stent which was deployed at 16  atmospheres.  The stent was well expanded and there was TIMI III flow.  There appeared to be a step-up into the stent as well as a step-down off  the distal into the stent so I elected not to post dilate initially.  At  that point, attention was turned to  the left circumflex. I initially  attempted to wire the left circumflex with a cougar guidewire but with  multiple attempts I was unable to due to the acute angulation off the  origin of the right coronary artery.  I then changed out to a whisper  guidewire and with some maneuvering of the guide catheter was able to  pass the whisper beyond the area of severe stenosis in the left  circumflex into the distal vessel. This lesion was dilated with a 2.0 x  12-mm Maverick balloon to 8 atmospheres for 60 seconds.  There was  improvement in the stenosis as the balloon was removed.  Intracoronary  nitroglycerin was given.  The vessel appeared to be approximately 2 mm  in diameter and I elected to stent it with a 2.0 x 12 mm multilength  mini Vision stent which was deployed at 14 atmospheres.  The stent  appeared to be well deployed.  There was TIMI III flow after stenting.  I then postdilated the stent with a 2.25 x 8-mm Quantum  Maverick balloon  up to 16 atmospheres.  There was an excellent angiographic result with  TIMI III flow in the vessel.  At that point after reviewing the  appearance of the right coronary artery, the vessel was larger after  nitroglycerin.  I went on to post dilate the stent in the mid-right  coronary artery with a 3.5 x 15-mm Quantum Maverick up to 18  atmospheres.  At the completion of the procedure,  angiographic images  were performed that demonstrated an excellent result in both vessels  with TIMI III flow.  The patient was chest pain free.  The guide  catheter was removed and the femoral arteriotomy was closed with a 6-  French Angio-Seal device.  The patient tolerated the procedure well.   CONCLUSION:  Successful PTCA and stenting of the right coronary artery  and left circumflex with single bare metal stents in each vessel.   PLAN:  Danny [name] should receive aspirin and Plavix in combination for  at least 30 days.  He will continue with aggressive medical therapy for  his residual LAD stenosis. He should be able to start chemotherapy in  the next week.      Veverly Fells. Excell Seltzer, MD  Electronically Signed     MDC/MEDQ  D:  02/10/2008  T:  02/10/2008  Job:  [id number]   cc:   Learta Codding, MD,FACC  Boris M. Darovsky, M.D.  Lia Hopping

## 2011-05-05 NOTE — Cardiovascular Report (Signed)
NAME:  Danny [name], Danny [name] NO.:  [id number]   MEDICAL RECORD NO.:  [id number]           PATIENT TYPE:   LOCATION:                                 FACILITY:   PHYSICIAN:  Veverly Fells. Excell Seltzer, MD  DATE OF BIRTH:  Aug 02, 1941   DATE OF PROCEDURE:  02/07/2008  DATE OF DISCHARGE:                            CARDIAC CATHETERIZATION   PROCEDURES:  1. Left heart catheterization.  2. Selective coronary angiography.  3. Left ventricular angiography.   INDICATIONS:  Mr. Ferrufino is a 70 year old man who previously was  diagnosed with nonobstructive CAD.  He has been more recently diagnosed  with Hodgkin's disease.  He has EKG and echo findings consistent with a  recent inferior MI.  He presented after sustaining an out-of-hospital  MI, and also had mildly elevated cardiac enzymes that were suspected to  be trending downward.  He presents today for cardiac cath to define his  coronary anatomy.  He also has plans for upcoming chemotherapy in the  very near future.   Risks and indications of the procedure were reviewed in detail with the  patient and family.  Informed consent was obtained.  The right groin was  prepped, draped, and anesthetized with 1% lidocaine using a modified  Seldinger technique.  A 4-French sheath was placed in the right femoral  artery.  Standard 4-French Judkins catheters were used for selective  coronary angiography.  An angled pigtail catheter was used for left  ventriculography.  The patient tolerated the procedure well and had no  immediate complications.   FINDINGS:  Aortic pressure 109/57 with a mean of 80, left ventricular  pressure 106/90.   CORONARY ANATOMY:  1. 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  The left      circumflex is anomalous.  The LAD is normal in its most proximal      segment.  There is a large first diagonal branch that supplies      multiple branches to the anterolateral wall.  In the mid-LAD just      beyond the  first diagonal branch there is an area of 80% stenosis.      There is a smaller second diagonal branch that arises from this      area.  After the stenotic lesion, the LAD has nonobstructive plaque      throughout its remaining course.  The LAD courses around the LV      apex.  2. Left circumflex has an anomalous origin off of the proximal right      coronary artery.  The left circumflex is a relatively small vessel.      It courses down and has a 90% lesion in in the proximal-to-      midportion of the vessel.  It there is no other significant disease      in the left circumflex.  3. The right coronary artery is subtotally occluded in the midportion.      There is a 99% stenosis in that area.  The distal branches of the      right coronary  artery, including the posterolateral branches and      PDA branch, fill both via antegrade flow as well as left-to-right      collaterals.  There is no other significant stenosis throughout the      right coronary artery.  4. Left ventricular function.  The basal and mid inferior wall are      dyskinetic.  The remaining portions of the LV are normal.  The LVEF      is 45%.  There is no significant mitral regurgitation.   ASSESSMENT:  1. Subtotal occlusion of the right coronary artery with prior inferior      wall MI and corresponding LV wall motion abnormality.  2. Severe mid-LAD stenosis.  3. Anomalous left circumflex with severe stenosis.  4. Mild left ventricular systolic dysfunction consistent with prior      inferior MI.   PLAN:  I am going to review Mr. Armes's films with colleagues.  This  is a difficult case because he requires immediate chemotherapy for his  Hodgkin's lymphoma.  The RCA is a large vessel that still has antegrade  flow. While he has sustained an inferior MI, the vessel is very large  and the wall motion abnormality is relatively small.  The LAD has  significant stenosis in its proximal-to-midportion as described. The   left circumflex has severe stenosis and is a moderate sized vessel with  an anomalous origin, which technically would be challenging to intervene  upon.  After reviewing his films we will make further recommendations. I  am initially leaning toward treating his RCA with a bare-metal stent so  that he can begin chemotherapy in the near future.      Veverly Fells. Excell Seltzer, MD  Electronically Signed     MDC/MEDQ  D:  02/07/2008  T:  02/08/2008  Job:  [id number]   cc:   Lebron Conners. Darovsky, M.D.  Learta Codding, MD,FACC

## 2011-05-05 NOTE — Assessment & Plan Note (Signed)
[facility]                                 ON-CALL NOTE   KAYSAN, PEIXOTO                      MRN:          [id number]  DATE:02/06/2008                            DOB:          06-Aug-1941    PRIMARY CARDIOLOGIST:  Dr. Andee Lineman in [place].   PRIMARY CARE PHYSICIAN:  Dr. Olena Leatherwood.   HISTORY:  Mr. Palmeri is a 70 year old white male who apparently was  evaluated by [facility] and Dr. Andee Lineman during a recent  hospitalization at [facility] for what sounds like reoccurring  Hodgkin's lymphoma.  During this time the patient states that he was  diagnosed with a heart attack and Dr. Andee Lineman arranged the outpatient  cardiac catheterization which was performed yesterday.  He is supposed  to return Friday for possible stenting. Postprocedure yesterday he was  started on Plavix.  Last evening, he stated that he was awakened with  diarrhea and diffuse sweating.  He did not have any chest discomfort or  symptoms reminiscent of his recent heart attack.  It is not clear as  specifically as to what time this occurred and the patient did not try  taking his temperature yesterday evening.  His wife had given him  sublingual nitroglycerin, but that did not have any effect on his  symptoms.   He called this morning due to concerns.   At this time Mr. Rotert states that he feels fine.  He is not having  any problems.  He also states he has not developed any rashes which is  the predominant reaction one may have with Plavix.   I suggested to Mr. Bernette Mayers his wife that he check his temperature to  make sure that he is not having a febrile illness and that he contact  Dr. Olena Leatherwood later this morning, in regards to his night sweats and  diarrhea.  I asked him to continue taking his medications.  However, if  his symptoms returned, I suggested preceding to the nearest emergency  room.  They were agreeable with this plan.      Joellyn Rued, PA-C  Electronically Signed      Jonelle Sidle, MD  Electronically Signed   EW/MedQ  DD: 02/08/2008  DT: 02/08/2008  Job #: [id number]

## 2011-05-08 NOTE — Assessment & Plan Note (Signed)
[facility]                          [facility]ICE NOTE   KURT, HOFFMEIER                      MRN:          [id number]  DATE:11/24/2006                            DOB:          Oct 31, 1941    PRIMARY CARE PHYSICIAN:  Dr. Lia Hopping   REASON FOR VISIT:  Followup bradycardia.   HISTORY OF PRESENT ILLNESS:  I saw Mr. Sohail back in October. He has a  history of presumed nonobstructive coronary artery disease based on a  cardiac catheterization at [facility] approximately  10 years ago. I await records for review. He had a standard treadmill  test and followup in July of 2006, which was reported as negative for  ischemia. At his last visit, we decreased atenolol to 12.5 mg daily and  his heart rate has increased some. Heart rate at last visit was 46 and  now is up to 52 and he has not reported any problems with exertional  chest pain, limiting dyspnea on exertion, dizziness or syncope.   ALLERGIES:  No known drug allergies.   PRESENT MEDICATIONS:  1. Aspirin 325 mg p.o. daily.  2. Atenolol 12.5 mg p.o. daily.  3. Vitamin E 300 international units p.o. daily.   REVIEW OF SYSTEMS:  As described in History of Present Illness.   EXAMINATION:  Blood pressure is 132/84, heart rate is 52, weight is 176  pounds. The patient is comfortable and in no acute distress.   NECK: Examination reveals no elevated jugular venous pressure, without  bruits. No thyromegaly is noted.  LUNGS:  Are clear without labored breathing.  CARDIAC: Reveals a regular rate and rhythm without loud murmur or  gallop.  EXTREMITIES: Show no pitting edema.   IMPRESSION/RECOMMENDATION:  1. Sinus bradycardia, largely asymptomatic at this time based on      patient's description. Will plan to continue atenolol at 12.5 mg      daily and have him follow up over the next 6 months for symptom      review. If he has any progressive problems in the  interim, he will      let us know.  2. History of presumed nonobstructive coronary artery disease based on      limited information. He did have a negative exercise treadmill test      by Dr. Olena Leatherwood in July of 2006. I await records from [facility] regarding his cardiac catheterization      approximately 10 years ago. He is not reporting any angina at this      time.  3. The patient will continue regular followup with Dr. Olena Leatherwood. If not      done recently, would      suggest a fasting lipid profile. In the setting of coronary artery      disease, his goal LDL cholesterol should be in the 70-80 range.     Jonelle Sidle, MD  Electronically Signed    SGM/MedQ  DD: 11/24/2006  DT: 11/24/2006  Job #: [id number]   cc:   Lia Hopping

## 2011-05-08 NOTE — Assessment & Plan Note (Signed)
[facility]                              CARDIOLOGY OFFICE NOTE   Danny [name], Danny [name]                      MRN:          [id number]  DATE:10/20/2006                            DOB:          [birth year]    REQUESTING PHYSICIAN:  Paulene Floor, M.D.   REASON FOR CONSULTATION:  Bradycardia.   HISTORY OF THE PRESENT ILLNESS:  Danny [name] is a pleasant 70 year old male  with the available history indicating hypertension but no major  cardiovascular disease or known cardiac dysrhythmias.  I do not have  complete information, but he tells me that he underwent a cardiac  catheterization at [facility] approximately nine or 10  years ago that showed only some mild blockages that were managed  medically.  Since then he has had follow up stress testing apparently at  [facility] approximately two to three years ago that was also  reassuring.  He is very active with many chores indoors and outdoors, and is  not limited by any exertional chest pain, persistent fatigue or dyspnea on  exertion beyond the New York Heart Association class II.   The patient has had some problems with a generalized rash recently and saw  Dr. Daphine Deutscher for this. It was noted at that time that the patient was  bradycardic and he was referred here to discuss things further.   Of note, he also had a recent episode of dizziness while he was at work,  this occurred approximately 1-1.5 weeks ago.  He has not had any syncope and  denies any specific palpable.  He had an episode years ago that was similar  where he also felt nauseated and diaphoretic.   The patient's electrocardiogram today showed sinus bradycardia at 46 beats  per minute.  Tracing from October 11, 2006 also showed sinus bradycardia at  49 beats/minute.   ALLERGIES:  No known drug allergies.   MEDICATIONS:  Present medications consists of:  1. Aspirin 325 mg by mouth daily.  2. Atenolol 25 by  mouth daily.  3. Vitamin E 300 units by mouth daily.  4. Hydroxyzine as needed.   PAST MEDICAL HISTORY:  The past medical history is  as outlined in the  history of present illness.   SOCIAL HISTORY:  The patient is married.  He has two children.  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  He denies any recent tobacco  use having quit in 1999.  He has no history of significant alcohol use.  He  drinks five caffeinated beverage a day.   FAMILY HISTORY:  The family history is significant for coronary artery  disease in the patient's brother in his 80s and  history of stroke in the  patient's father in his 54s.   REVIEW OF SYSTEMS:  This  review of systems is a noted in the history of  present illness and is otherwise negative.   PHYSICAL EXAMINATION:  VITAL SIGNS:  Blood pressure 158/72, heart rate 46  and regular, and weight is 175 pounds.  GENERAL APPEARANCE:  The patient is a  normally-nourished, well-developed  male in no acute distress.  HEENT:  Conjunctivae are normal.  Pharynx is clear.  NECK:  The neck is supple with elevated jugular venous pressure, and without  bruits.  No thyromegaly is noted.  LUNGS:  The patient lungs are clear without labored breathing.  HEART:  Cardiac exam reveals a regular rate and rhythm without loud murmur  or S3 gallop.  ABDOMEN:  The abdomen is  soft.  No bruits noted.  Bowel sounds are present.  EXTREMITIES:  The extremities shows no significant pitting edema.  Distal  pulsed are 2+.  SKIN:  The skin reveals no ulcerative changes.  MUSCULOSKELETAL:  No kyphosis noted.   IMPRESSION AND RECOMMENDATIONS:  1. Sinus bradycardia.  It is not entirely clear that the patient's recent      and remote episodes of dizziness were rhythm related, but it is      certainly possible that he has transiently more significant bradycardia      or perhaps even pauses.  He is on atenolol and states that he has been      on this medicine for the  last 10 years.  He otherwise is not      manifesting any exertional symptoms to suggest that coronary artery      disease is clearly related.  We will plan to obtain his catheterization      records from years ago as well as his most recent stress test.  In the      meanwhile I have asked him to decrease his atenolol to 12.5 mg daily.      We will have him follow up in our [facility] as this will be more      convenient for him and to determine whether we need to discontinued      beta blocker therapy altogether and substitute another medication for      blood pressure control.  Since he does not have frequent symptomatology      I am not certain an event recorder would be of much use at this time.      We may have to simply observe him.  If he manifest progressive symptoms      we will consider additional testing. He and his wife are comfortable      with this.  2. Further plans are to follow.     Jonelle Sidle, MD  Electronically Signed    SGM/MedQ  DD: 10/20/2006  DT: 10/20/2006  Job #: [id number]   cc:   Paulene Floor, M.D.  Lia Hopping

## 2011-09-11 LAB — BASIC METABOLIC PANEL
BUN: 14
CO2: 26
Calcium: 10
GFR calc Af Amer: >60
GFR calc non Af Amer: >60
GFR calc non Af Amer: >60
Glucose, Bld: 95
Potassium: 4
Sodium: 140

## 2011-09-11 LAB — CBC
HCT: 41.4
Hemoglobin: 16.2
MCHC: 34.2
MCV: 96.8
Platelets: 297
RBC: 5.02
RDW: 13.6
RDW: 14.3

## 2011-09-11 LAB — CK TOTAL AND CKMB (NOT AT ARMC): CK, MB: 3.1

## 2011-09-11 LAB — APTT: aPTT: 32

## 2011-09-11 LAB — PROTIME-INR: INR: 1

## 2011-09-11 LAB — LIPID PANEL: Triglycerides: 75

## 2011-10-27 ENCOUNTER — Other Ambulatory Visit: Payer: Self-pay | Admitting: Cardiology

## 2011-12-14 ENCOUNTER — Encounter: Payer: Self-pay | Admitting: *Deleted

## 2011-12-24 ENCOUNTER — Ambulatory Visit (INDEPENDENT_AMBULATORY_CARE_PROVIDER_SITE_OTHER): Payer: Private Health Insurance - Indemnity | Admitting: Physician Assistant

## 2011-12-24 ENCOUNTER — Encounter: Payer: Self-pay | Admitting: Cardiology

## 2011-12-24 VITALS — BP 145/73 | HR 54 | Ht 71.0 in | Wt 181.0 lb

## 2011-12-24 DIAGNOSIS — I2510 Atherosclerotic heart disease of native coronary artery without angina pectoris: Secondary | ICD-10-CM

## 2011-12-24 DIAGNOSIS — I498 Other specified cardiac arrhythmias: Secondary | ICD-10-CM

## 2011-12-24 DIAGNOSIS — R55 Syncope and collapse: Secondary | ICD-10-CM

## 2011-12-24 DIAGNOSIS — Z79899 Other long term (current) drug therapy: Secondary | ICD-10-CM

## 2011-12-24 DIAGNOSIS — E782 Mixed hyperlipidemia: Secondary | ICD-10-CM

## 2011-12-24 DIAGNOSIS — R001 Bradycardia, unspecified: Secondary | ICD-10-CM | POA: Insufficient documentation

## 2011-12-24 MED ORDER — NITROGLYCERIN 0.4 MG SL SUBL: 0.4000 mg | SUBLINGUAL_TABLET | SUBLINGUAL | Status: DC | PRN

## 2011-12-24 NOTE — Assessment & Plan Note (Signed)
Asymptomatic; continue current dose of carvedilol.

## 2011-12-24 NOTE — Progress Notes (Signed)
HPI: Patient presents for scheduled followup, since last seen here in clinic, by Dr. Diona Browner, in January 2012.   Although he denies any interim development of exertional CP or DOE, he relates an episode of near-syncope, approximately 2 months ago, while at his job. He experienced "blurred vision", while standing, with no antecedent CP, SOB, diaphoresis, nausea, or palpitations. He had one prior remote episode, and did not seek medical attention in either case. He has not had any recurrent episodes.  He also points out that he has a chronically low resting heart rate (45 to 50s), but denies any associated symptoms.  Allergies  Allergen Reactions  . Diphenhydramine Hcl     REACTION: hyper/aggressive    Current Outpatient Prescriptions  Medication Sig Dispense Refill  . aspirin EC 81 MG tablet Take 81 mg by mouth daily.        . carvedilol (COREG) 6.25 MG tablet TAKE ONE TABLET BY MOUTH TWICE DAILY  60 tablet  5  . nitroGLYCERIN (NITROSTAT) 0.4 MG SL tablet Place 0.4 mg under the tongue every 5 (five) minutes as needed.        . simvastatin (ZOCOR) 40 MG tablet Take 1 tablet (40 mg total) by mouth every evening.  30 tablet  6    Past Medical History  Diagnosis Date  . CAD (coronary artery disease)     CAD - BMS RCA and circ 2/09, residual 75% LAD, LVEF 45%  . Hyperlipidemia   . Myocardial infarction     out of hospital IMI  . Hodgkin's lymphoma     stage IIIB  . Sinus bradycardia   . Deep venous thrombosis     Left upper extremity deep venous thrombosis    History   Social History  . Marital Status: Married    Spouse Name: N/A    Number of Children: N/A  . Years of Education: N/A   Occupational History  . Not on file.   Social History Main Topics  . Smoking status: Former Smoker    Types: Cigarettes    Quit date: 12/22/1991  . Smokeless tobacco: Not on file  . Alcohol Use: Not on file  . Drug Use: Not on file  . Sexually Active: Not on file   Other Topics Concern    . Not on file   Social History Narrative  . No narrative on file    Family History  Problem Relation Age of Onset  . Coronary artery disease      family history of    ROS: no nausea, vomiting; no fever, chills; no melena, hematochezia; no claudication;   denies frank syncope, dizziness, or lightheadedness  PHYSICAL EXAM:  Ht 5\' 11"  (1.803 m) GENERAL: 71 year-old male, sitting upright, NAD; sclera clear; no xanthelasma NECK: palpable bilateral carotid pulses, no bruits; no JVD; no TM LUNGS: CTA bilaterally CARDIAC: RRR (S1, S2); no significant murmurs; no rubs or gallops ABDOMEN: soft, non-tender; intact BS EXTREMETIES: intact distal pulses; no significant peripheral edema SKIN: warm/dry; no obvious rash/lesions MUSCULOSKELETAL: no joint deformity NEURO: no focal deficit; NL affect  EKG: reviewed and available in Electronic Records   ASSESSMENT & PLAN:

## 2011-12-24 NOTE — Patient Instructions (Signed)
Your physician wants you to follow-up in: 6 months. You will receive a reminder letter in the mail one-two months in advance. If you don't receive a letter, please call our office to schedule the follow-up appointment. Your physician recommends that you continue on your current medications as directed. Please refer to the Current Medication list given to you today. Your physician recommends that you go to the [facility] for a FASTING lipid profile and liver function labs. Do not eat or drink after midnight.  If the results of your test are normal or stable, you will receive a letter. If they are abnormal, the nurse will contact you by phone.  

## 2011-12-24 NOTE — Assessment & Plan Note (Addendum)
A surveillance exercise stress Cardiolite was recommended, since it is now nearly 4 years since his MI/PCI. However, as noted, patient declines any formal testing at this point in time, but will consider this when he returns in 6 months. We'll renew prescription for NTG.

## 2011-12-24 NOTE — Assessment & Plan Note (Signed)
An exercise stress Cardiolite test, as well as a 2-D echocardiogram, was recommended for further evaluation. However, patient declines at this point, but agreed to reconsider this, when he returns in 6 months.

## 2011-12-24 NOTE — Assessment & Plan Note (Signed)
We'll reassess lipid status with FLP/LFT profile. Prior study, 1/12, yielded evidence of excellent control with LDL 54. Target LDL 70 or less, if feasible, is recommended.

## 2011-12-24 NOTE — Progress Notes (Signed)
Patient seen and examined. Discussed with [name]. Clinically, he has been relatively stable, no obvious angina. Has had some episodes of dizziness when standing, no definite palpitations. Heart rate tends to lock to run low, limiting further titration of Coreg over time. He's had no definite syncope.  On examination, blood pressure 145/73, heart rate 54. Lungs are clear. Cardiac exam with regular rate and rhythm, no significant murmur. Still has a right-sided Port-A-Cath in place - states that he will likely have this removed as he no longer has required any chemotherapy.  We did discuss followup ischemic testing in light of the amount of time that has passed since his previous interventions in 2009. He remains firm that he prefers observation only on medical therapy at this point.

## 2011-12-31 ENCOUNTER — Encounter: Payer: Self-pay | Admitting: *Deleted

## 2012-04-26 ENCOUNTER — Other Ambulatory Visit: Payer: Self-pay | Admitting: Cardiology

## 2012-07-19 ENCOUNTER — Ambulatory Visit: Payer: Private Health Insurance - Indemnity | Admitting: Cardiology

## 2012-08-09 ENCOUNTER — Encounter: Payer: Self-pay | Admitting: Cardiology

## 2012-08-09 ENCOUNTER — Ambulatory Visit (INDEPENDENT_AMBULATORY_CARE_PROVIDER_SITE_OTHER): Payer: BC Managed Care – PPO | Admitting: Cardiology

## 2012-08-09 VITALS — BP 126/77 | HR 61 | Ht 71.0 in | Wt 171.1 lb

## 2012-08-09 DIAGNOSIS — R001 Bradycardia, unspecified: Secondary | ICD-10-CM

## 2012-08-09 DIAGNOSIS — I2510 Atherosclerotic heart disease of native coronary artery without angina pectoris: Secondary | ICD-10-CM

## 2012-08-09 DIAGNOSIS — I498 Other specified cardiac arrhythmias: Secondary | ICD-10-CM

## 2012-08-09 DIAGNOSIS — I10 Essential (primary) hypertension: Secondary | ICD-10-CM

## 2012-08-09 DIAGNOSIS — E782 Mixed hyperlipidemia: Secondary | ICD-10-CM

## 2012-08-09 MED ORDER — NITROGLYCERIN 0.4 MG SL SUBL: 0.4000 mg | SUBLINGUAL_TABLET | SUBLINGUAL | Status: DC | PRN

## 2012-08-09 NOTE — Assessment & Plan Note (Signed)
Blood pressure is reasonably well controlled today. I asked him to bring in a record of home blood pressure checks. ACE Inhibitor therapy might be considered if he would be agreeable.

## 2012-08-09 NOTE — Patient Instructions (Addendum)
Your physician recommends that you schedule a follow-up appointment in: 6 months. You will receive a reminder letter in the mail in about 4 months reminding you to call and schedule your appointment. If you don't receive this letter, please contact our office.  Your physician recommends that you continue on your current medications as directed. Please refer to the Current Medication list given to you today.  Your physician recommends that you return for a FASTING lipid/liver profile in 6 months just before your next appointment at [facility].

## 2012-08-09 NOTE — Assessment & Plan Note (Signed)
Lipids have been well controlled. Followup FLP and LFT for next visit.

## 2012-08-09 NOTE — Assessment & Plan Note (Signed)
Continue observation on medical therapy. Danny [name] has not wanted to pursue any followup ischemic or structural cardiac testing.

## 2012-08-09 NOTE — Progress Notes (Signed)
Clinical Summary Mr. Helzer is a 71 y.o.male presenting for followup. He was last seen in January by [name]. He has preferred a conservative approach to his cardiac status, on fairly minimal medical therapy. He has not worn to pursue any followup invasive testing over time.  Fortunately, with this approach he has not had any progressive angina symptoms. 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 He reports compliance with his medications.  He does have dependent mild ankle edema at the end of the day but this resolves fairly quickly. He has not required a diuretic. Weight is down 10 pounds from the last visit.  Lab work from January showed normal AST and ALT, triglycerides 71, cholesterol 128, HDL 53, LDL 61.   Allergies  Allergen Reactions  . Diphenhydramine Hcl     REACTION: hyper/aggressive    Current Outpatient Prescriptions  Medication Sig Dispense Refill  . aspirin EC 81 MG tablet Take 81 mg by mouth daily.        . carvedilol (COREG) 6.25 MG tablet TAKE ONE TABLET BY MOUTH TWICE DAILY  60 tablet  6  . simvastatin (ZOCOR) 40 MG tablet TAKE ONE TABLET BY MOUTH EVERY DAY IN THE EVENING  30 tablet  6  . DISCONTD: nitroGLYCERIN (NITROSTAT) 0.4 MG SL tablet Place 1 tablet (0.4 mg total) under the tongue every 5 (five) minutes as needed.  25 tablet  1  . nitroGLYCERIN (NITROSTAT) 0.4 MG SL tablet Place 1 tablet (0.4 mg total) under the tongue every 5 (five) minutes as needed.  25 tablet  2    Past Medical History  Diagnosis Date  . CAD (coronary artery disease)     CAD - BMS RCA and circ 2/09, residual 75% LAD, LVEF 45%  . Hyperlipidemia   . Myocardial infarction     out of hospital IMI  . Hodgkin's lymphoma     stage IIIB  . Sinus bradycardia   . Deep venous thrombosis     Left upper extremity deep venous thrombosis  . LV dysfunction     EF 40-45%, by 2D echo, 2/09  . MR (mitral regurgitation)     mild, by 2D echo, 2/09    Social History Mr. Swift  reports that he quit smoking about 20 years ago. His smoking use included Cigarettes. He has a 30 pack-year smoking history. He quit smokeless tobacco use about 6 years ago. His smokeless tobacco use included Chew. Mr. Zapanta has no alcohol history on file.  Review of Systems No palpitations. No reported bleeding problems. No syncope. No orthopnea.  Physical Examination Filed Vitals:   08/09/12 1405  BP: 126/77  Pulse: 61   Elderly male in no acute distress. HEENT: Conjunctiva and lids normal, oropharynx clear with moist mucosa. Neck: Supple, no elevated JVP or carotid bruits, no thyromegaly. Lungs: Clear to auscultation, nonlabored breathing at rest. Cardiac: Regular rate and rhythm, no S3 or significant systolic murmur, no pericardial rub. Abdomen: Soft, nontender, bowel sounds present, no guarding or rebound. Extremities: Trace edema, distal pulses 1-2+. Skin: Warm and dry. Musculoskeletal: No kyphosis. Neuropsychiatric: Alert and oriented x3, affect grossly appropriate.    Problem List and Plan   CORONARY ATHEROSCLEROSIS NATIVE CORONARY ARTERY Continue observation on medical therapy. Mr. Rathe has not wanted to pursue any followup ischemic or structural cardiac testing.  ESSENTIAL HYPERTENSION, BENIGN Blood pressure is reasonably well controlled today. I asked him to bring in a record of home blood pressure checks. ACE Inhibitor therapy  might be considered if he would be agreeable.  MIXED HYPERLIPIDEMIA Lipids have been well controlled. Followup FLP and LFT for next visit.  Sinus bradycardia Heart rate stable, not clearly symptomatic.    Jonelle Sidle, M.D., F.A.C.C.

## 2012-08-09 NOTE — Assessment & Plan Note (Signed)
Heart rate stable, not clearly symptomatic.

## 2012-11-21 ENCOUNTER — Other Ambulatory Visit: Payer: Self-pay | Admitting: Cardiology

## 2012-11-28 ENCOUNTER — Other Ambulatory Visit: Payer: Self-pay | Admitting: Cardiology

## 2012-12-19 ENCOUNTER — Other Ambulatory Visit: Payer: Self-pay | Admitting: Cardiology

## 2013-02-14 ENCOUNTER — Encounter: Payer: Self-pay | Admitting: Cardiology

## 2013-02-14 ENCOUNTER — Ambulatory Visit (INDEPENDENT_AMBULATORY_CARE_PROVIDER_SITE_OTHER): Payer: BC Managed Care – PPO | Admitting: Cardiology

## 2013-02-14 VITALS — BP 142/78 | HR 56 | Ht 71.0 in | Wt 179.0 lb

## 2013-02-14 DIAGNOSIS — I2510 Atherosclerotic heart disease of native coronary artery without angina pectoris: Secondary | ICD-10-CM

## 2013-02-14 DIAGNOSIS — E782 Mixed hyperlipidemia: Secondary | ICD-10-CM

## 2013-02-14 DIAGNOSIS — Z79899 Other long term (current) drug therapy: Secondary | ICD-10-CM

## 2013-02-14 NOTE — Assessment & Plan Note (Signed)
Continue medical therapy and observation. Patient prefers to hold off on followup ischemic testing. ECG is stable.

## 2013-02-14 NOTE — Patient Instructions (Addendum)
Your physician recommends that you schedule a follow-up appointment in: 6 months. You will receive a reminder letter in the mail in about 4 months reminding you to call and schedule your appointment. If you don't receive this letter, please contact our office. Your physician recommends that you continue on your current medications as directed. Please refer to the Current Medication list given to you today. Your physician recommends that you return for a FASTING lipid/liver profile this week. Please have this done at [facility].

## 2013-02-14 NOTE — Progress Notes (Signed)
   Clinical Summary Danny [name] is a 72 y.o.male presenting for followup. He was last seen in August 2013. He reports no progressive angina symptoms. States he actually did some snow shoveling last week. He reports compliance with his medications.  He is due for followup assessment of lipids. ECG today shows sinus bradycardia with nonspecific T-wave changes.  He continues to prefer observation, holding off on followup ischemic testing.  Allergies  Allergen Reactions  . Diphenhydramine Hcl     REACTION: hyper/aggressive    Current Outpatient Prescriptions  Medication Sig Dispense Refill  . aspirin EC 81 MG tablet Take 81 mg by mouth daily.        . carvedilol (COREG) 6.25 MG tablet TAKE ONE TABLET BY MOUTH TWICE DAILY  60 tablet  2  . nitroGLYCERIN (NITROSTAT) 0.4 MG SL tablet Place 1 tablet (0.4 mg total) under the tongue every 5 (five) minutes as needed.  25 tablet  2  . simvastatin (ZOCOR) 40 MG tablet TAKE ONE TABLET BY MOUTH IN THE EVENING  30 tablet  5   No current facility-administered medications for this visit.    Past Medical History  Diagnosis Date  . Coronary atherosclerosis of native coronary artery     BMS RCA and circ 2/09, residual 75% LAD, LVEF 45%  . Hyperlipidemia   . Myocardial infarction     Out of hospital IMI  . Hodgkin's lymphoma     Stage IIIB  . Sinus bradycardia   . Deep venous thrombosis     Left upper extremity deep venous thrombosis  . Cardiomyopathy     EF 40-45%, by 2D echo, 2/09    Social History Danny [name] reports that he quit smoking about 21 years ago. His smoking use included Cigarettes. He has a 30 pack-year smoking history. He quit smokeless tobacco use about 7 years ago. His smokeless tobacco use included Chew. Danny [name] has no alcohol history on file.  Review of Systems No palpitations, orthopnea, PND.  Physical Examination Filed Vitals:   02/14/13 1348  BP: 142/78  Pulse: 56   Filed Weights   02/14/13 1348  Weight: 179  lb (81.194 kg)    Elderly male in no acute distress.  HEENT: Conjunctiva and lids normal, oropharynx clear with moist mucosa.  Neck: Supple, no elevated JVP or carotid bruits, no thyromegaly.  Lungs: Clear to auscultation, nonlabored breathing at rest.  Cardiac: Regular rate and rhythm, no S3 or significant systolic murmur, no pericardial rub.  Abdomen: Soft, nontender, bowel sounds present, no guarding or rebound.  Extremities: Trace edema, distal pulses 1-2+.    Problem List and Plan   CORONARY ATHEROSCLEROSIS NATIVE CORONARY ARTERY Continue medical therapy and observation. Patient prefers to hold off on followup ischemic testing. ECG is stable.  MIXED HYPERLIPIDEMIA Due for followup FLP and LFT. These will be arranged. Continue Zocor.    Danny [name], M.D., F.A.C.C.

## 2013-02-14 NOTE — Assessment & Plan Note (Signed)
Due for followup FLP and LFT. These will be arranged. Continue Zocor.

## 2013-02-21 ENCOUNTER — Telehealth: Payer: Self-pay | Admitting: *Deleted

## 2013-02-21 NOTE — Telephone Encounter (Signed)
Patient informed. 

## 2013-02-21 NOTE — Telephone Encounter (Signed)
Left message for patient to call office.  

## 2013-02-21 NOTE — Telephone Encounter (Signed)
Message copied by Eustace Moore on Tue Feb 21, 2013  4:00 PM ------      Message from: MCDOWELL, Illene Bolus      Created: Wed Feb 15, 2013 11:20 AM       AST and ALT normal. LDL aggressively controlled at 47. Continue same. ------

## 2013-03-22 ENCOUNTER — Other Ambulatory Visit: Payer: Self-pay | Admitting: Cardiology

## 2013-04-18 ENCOUNTER — Other Ambulatory Visit: Payer: Self-pay | Admitting: Cardiology

## 2013-05-17 ENCOUNTER — Other Ambulatory Visit: Payer: Self-pay | Admitting: Cardiology

## 2013-06-01 ENCOUNTER — Other Ambulatory Visit: Payer: Self-pay | Admitting: *Deleted

## 2013-06-01 MED ORDER — SIMVASTATIN 40 MG PO TABS: 40.0000 mg | ORAL_TABLET | Freq: Every day | ORAL | Status: DC

## 2013-06-16 ENCOUNTER — Other Ambulatory Visit: Payer: Self-pay | Admitting: Cardiology

## 2013-06-16 MED ORDER — CARVEDILOL 6.25 MG PO TABS: 6.2500 mg | ORAL_TABLET | Freq: Two times a day (BID) | ORAL | Status: DC

## 2013-07-27 ENCOUNTER — Ambulatory Visit (INDEPENDENT_AMBULATORY_CARE_PROVIDER_SITE_OTHER): Payer: BC Managed Care – PPO | Admitting: Physician Assistant

## 2013-07-27 ENCOUNTER — Encounter: Payer: Self-pay | Admitting: Physician Assistant

## 2013-07-27 VITALS — BP 141/73 | HR 46 | Ht 71.0 in | Wt 175.0 lb

## 2013-07-27 DIAGNOSIS — I428 Other cardiomyopathies: Secondary | ICD-10-CM

## 2013-07-27 DIAGNOSIS — I498 Other specified cardiac arrhythmias: Secondary | ICD-10-CM

## 2013-07-27 DIAGNOSIS — I429 Cardiomyopathy, unspecified: Secondary | ICD-10-CM | POA: Insufficient documentation

## 2013-07-27 DIAGNOSIS — E782 Mixed hyperlipidemia: Secondary | ICD-10-CM

## 2013-07-27 DIAGNOSIS — I2510 Atherosclerotic heart disease of native coronary artery without angina pectoris: Secondary | ICD-10-CM

## 2013-07-27 DIAGNOSIS — R001 Bradycardia, unspecified: Secondary | ICD-10-CM

## 2013-07-27 MED ORDER — NITROGLYCERIN 0.4 MG SL SUBL: 0.4000 mg | SUBLINGUAL_TABLET | SUBLINGUAL | Status: DC | PRN

## 2013-07-27 NOTE — Progress Notes (Signed)
Primary Cardiologist: Simona Huh, MD   HPI: Scheduled six-month followup.  He returns reporting no interim development of exertional CP. 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 He states that he has been there for a "very long time".  Patient denies any symptoms of near syncope/syncope. He has not experienced any dizziness in over 2 years. He quit smoking 21 years ago.  Allergies  Allergen Reactions  . Diphenhydramine Hcl     REACTION: hyper/aggressive    Current Outpatient Prescriptions  Medication Sig Dispense Refill  . aspirin EC 81 MG tablet Take 81 mg by mouth daily.        . carvedilol (COREG) 6.25 MG tablet Take 1 tablet (6.25 mg total) by mouth 2 (two) times daily with a meal.  60 tablet  2  . nitroGLYCERIN (NITROSTAT) 0.4 MG SL tablet Place 1 tablet (0.4 mg total) under the tongue every 5 (five) minutes as needed.  25 tablet  3  . simvastatin (ZOCOR) 40 MG tablet Take 1 tablet (40 mg total) by mouth at bedtime.  30 tablet  6   No current facility-administered medications for this visit.    Past Medical History  Diagnosis Date  . Coronary atherosclerosis of native coronary artery     BMS RCA and circ 2/09, residual 75% LAD, LVEF 45%  . Hyperlipidemia   . Myocardial infarction     Out of hospital IMI  . Hodgkin's lymphoma     Stage IIIB  . Sinus bradycardia   . Deep venous thrombosis     Left upper extremity deep venous thrombosis  . Cardiomyopathy     EF 40-45%, by 2D echo, 2/09    Past Surgical History  Procedure Laterality Date  . Port-a-cath placement      History   Social History  . Marital Status: Married    Spouse Name: N/A    Number of Children: N/A  . Years of Education: N/A   Occupational History  . Not on file.   Social History Main Topics  . Smoking status: Former Smoker -- 1.00 packs/day for 30 years    Types: Cigarettes    Quit date: 12/22/1991  . Smokeless tobacco: Former Neurosurgeon    Types:  Chew    Quit date: 12/21/2005     Comment: chewed one pack per week times 20 years  . Alcohol Use: Not on file  . Drug Use: Not on file  . Sexually Active: Not on file   Other Topics Concern  . Not on file   Social History Narrative  . No narrative on file    Family History  Problem Relation Age of Onset  . Coronary artery disease      family history of    ROS: no nausea, vomiting; no fever, chills; no melena, hematochezia; no claudication  PHYSICAL EXAM: BP 141/73  Pulse 46  Ht 5\' 11"  (1.803 m)  Wt 175 lb (79.379 kg)  BMI 24.42 kg/m2 GENERAL: 72 male; NAD HEENT: NCAT, PERRLA, EOMI; sclera clear; no xanthelasma NECK: palpable bilateral carotid pulses, no bruits; no JVD; no TM LUNGS: CTA bilaterally CARDIAC: Regular rhythm, decreased rate (S1, S2); no significant murmurs; no rubs or gallops ABDOMEN: soft, non-tender; intact BS EXTREMETIES: no significant peripheral edema SKIN: warm/dry; no obvious rash/lesions MUSCULOSKELETAL: no joint deformity NEURO: no focal deficit; NL affect   EKG:    ASSESSMENT & PLAN:  CORONARY ATHEROSCLEROSIS NATIVE CORONARY ARTERY Quiescent on current  medication regimen. He would like to defer consideration for a surveillance ischemic evaluation until his next OV. Will renew prescription for prn NTG.  Cardiomyopathy Unable to uptitrate carvedilol, secondary to resting bradycardia.  Mixed hyperlipidemia Well-controlled on current dose of simvastatin, with LDL 47 following previous OV.  Sinus bradycardia Chronic, asymptomatic. Tolerating same dose carvedilol.    Gene Owen Pagnotta, PAC

## 2013-07-27 NOTE — Patient Instructions (Signed)
Continue all current medications. Nitroglycerin as needed for severe chest pain only - refill sent to pharm Your physician wants you to follow up in: 6 months.  You will receive a reminder letter in the mail one-two months in advance.  If you don't receive a letter, please call our office to schedule the follow up appointment   

## 2013-07-27 NOTE — Assessment & Plan Note (Signed)
Unable to uptitrate carvedilol, secondary to resting bradycardia.

## 2013-07-27 NOTE — Assessment & Plan Note (Signed)
Chronic, asymptomatic. Tolerating same dose carvedilol.

## 2013-07-27 NOTE — Assessment & Plan Note (Signed)
Well-controlled on current dose of simvastatin, with LDL 47 following previous OV.

## 2013-07-27 NOTE — Assessment & Plan Note (Signed)
Quiescent on current medication regimen. He would like to defer consideration for a surveillance ischemic evaluation until his next OV. Will renew prescription for prn NTG.

## 2013-09-18 ENCOUNTER — Other Ambulatory Visit: Payer: Self-pay | Admitting: *Deleted

## 2013-09-18 MED ORDER — CARVEDILOL 6.25 MG PO TABS: 6.2500 mg | ORAL_TABLET | Freq: Two times a day (BID) | ORAL | Status: DC

## 2013-12-26 ENCOUNTER — Other Ambulatory Visit: Payer: Self-pay | Admitting: *Deleted

## 2013-12-26 MED ORDER — SIMVASTATIN 40 MG PO TABS: 40.0000 mg | ORAL_TABLET | Freq: Every day | ORAL | Status: DC

## 2014-02-06 ENCOUNTER — Ambulatory Visit: Payer: BC Managed Care – PPO | Admitting: Cardiology

## 2014-03-02 ENCOUNTER — Ambulatory Visit (INDEPENDENT_AMBULATORY_CARE_PROVIDER_SITE_OTHER): Payer: BC Managed Care – PPO | Admitting: Cardiology

## 2014-03-02 ENCOUNTER — Encounter: Payer: Self-pay | Admitting: Cardiology

## 2014-03-02 VITALS — BP 145/74 | HR 47 | Ht 71.0 in | Wt 174.8 lb

## 2014-03-02 DIAGNOSIS — I255 Ischemic cardiomyopathy: Secondary | ICD-10-CM

## 2014-03-02 DIAGNOSIS — E782 Mixed hyperlipidemia: Secondary | ICD-10-CM

## 2014-03-02 DIAGNOSIS — I2510 Atherosclerotic heart disease of native coronary artery without angina pectoris: Secondary | ICD-10-CM | POA: Diagnosis not present

## 2014-03-02 DIAGNOSIS — I10 Essential (primary) hypertension: Secondary | ICD-10-CM

## 2014-03-02 DIAGNOSIS — I2589 Other forms of chronic ischemic heart disease: Secondary | ICD-10-CM

## 2014-03-02 NOTE — Assessment & Plan Note (Signed)
Symptomatically stable medical therapy. ECG reviewed and also stable. He is bradycardic but not clearly symptomatic. At this point he is most comfortable with observation, no followup ischemic testing unless he has progressive symptoms.

## 2014-03-02 NOTE — Assessment & Plan Note (Signed)
Blood pressure mildly elevated today. Continue current medications, can always consider addition of ACE inhibitor or ARB, keep follow up with Dr. Sherrie Sport.

## 2014-03-02 NOTE — Progress Notes (Signed)
Clinical Summary [name] is a 73 y.o.male last seen in the office by [name] PA-C in August 2014. Medical therapy was continued at that time. He reports no angina symptoms, no nitroglycerin use. He has had no significant illnesses or hospitalizations since I last saw him.  ECG today shows sinus bradycardia.  Adenosine Cardiolite from April 2009 showed no evidence of ischemia, inferior wall scar, LVEF 53%. He has generally preferred to hold off followup ischemic testing in the subsequent years. This remains the case today.  He has not had a recent lipid panel or liver function tests, has not seen Dr. Sherrie Sport in a while.   Allergies  Allergen Reactions  . Diphenhydramine Hcl     REACTION: hyper/aggressive    Current Outpatient Prescriptions  Medication Sig Dispense Refill  . aspirin EC 81 MG tablet Take 81 mg by mouth daily.        . carvedilol (COREG) 6.25 MG tablet Take 1 tablet (6.25 mg total) by mouth 2 (two) times daily with a meal.  60 tablet  6  . nitroGLYCERIN (NITROSTAT) 0.4 MG SL tablet Place 1 tablet (0.4 mg total) under the tongue every 5 (five) minutes as needed.  25 tablet  3  . simvastatin (ZOCOR) 40 MG tablet Take 1 tablet (40 mg total) by mouth at bedtime.  30 tablet  6   No current facility-administered medications for this visit.    Past Medical History  Diagnosis Date  . Coronary atherosclerosis of native coronary artery     BMS RCA and circ 2/09, residual 75% LAD, LVEF 45%  . Hyperlipidemia   . Myocardial infarction     Out of hospital IMI  . Hodgkin's lymphoma     Stage IIIB  . Sinus bradycardia   . Deep venous thrombosis     Left upper extremity deep venous thrombosis  . Cardiomyopathy     EF 40-45%, by 2D echo, 2/09    Social History [name] reports that he quit smoking about 22 years ago. His smoking use included Cigarettes. He has a 30 pack-year smoking history. He quit smokeless tobacco use about 8 years ago. His smokeless tobacco  use included Chew. [name] has no alcohol history on file.  Review of Systems No palpitations, dizziness, syncope. Stable appetite. Otherwise negative.  Physical Examination Filed Vitals:   03/02/14 1438  BP: 145/74  Pulse: 47   Filed Weights   03/02/14 1438  Weight: 174 lb 12.8 oz (79.289 kg)    Elderly male in no acute distress.  HEENT: Conjunctiva and lids normal, oropharynx clear with moist mucosa.  Neck: Supple, no elevated JVP or carotid bruits, no thyromegaly.  Lungs: Clear to auscultation, nonlabored breathing at rest.  Cardiac: Regular rate and rhythm, no S3 or significant systolic murmur, no pericardial rub.  Abdomen: Soft, nontender, bowel sounds present, no guarding or rebound.  Extremities: Trace edema, distal pulses 1-2+.  Skin: Warm and dry. Musculoskeletal: No kyphosis. Neuropsychiatric: Alert and oriented x3, affect appropriate.   Problem List and Plan   CORONARY ATHEROSCLEROSIS NATIVE CORONARY ARTERY Symptomatically stable medical therapy. ECG reviewed and also stable. He is bradycardic but not clearly symptomatic. At this point he is most comfortable with observation, no followup ischemic testing unless he has progressive symptoms.  Essential hypertension, benign Blood pressure mildly elevated today. Continue current medications, can always consider addition of ACE inhibitor or ARB, keep follow up with Dr. Sherrie Sport.  Mixed hyperlipidemia He continues on Zocor. FLP and  LFT arranged.  Cardiomyopathy LVEF low-normal range by previous Cardiolite.    Satira Sark, M.D., F.A.C.C.

## 2014-03-02 NOTE — Assessment & Plan Note (Signed)
LVEF low-normal range by previous Cardiolite.

## 2014-03-02 NOTE — Patient Instructions (Signed)
   Labs for cholesterol (FLP / LFT) - orders given today Office will contact with results via phone or letter.   Continue all current medications. Your physician wants you to follow up in: 6 months.  You will receive a reminder letter in the mail one-two months in advance.  If you don't receive a letter, please call our office to schedule the follow up appointment

## 2014-03-02 NOTE — Assessment & Plan Note (Signed)
He continues on Zocor. FLP and LFT arranged.

## 2014-03-06 DIAGNOSIS — E782 Mixed hyperlipidemia: Secondary | ICD-10-CM | POA: Diagnosis not present

## 2014-03-06 DIAGNOSIS — I2510 Atherosclerotic heart disease of native coronary artery without angina pectoris: Secondary | ICD-10-CM | POA: Diagnosis not present

## 2014-03-12 ENCOUNTER — Telehealth: Payer: Self-pay | Admitting: *Deleted

## 2014-03-12 NOTE — Telephone Encounter (Signed)
Patient informed via message machine. 

## 2014-03-12 NOTE — Telephone Encounter (Signed)
Message copied by Merlene Laughter on Mon Mar 12, 2014  8:56 AM ------      Message from: MCDOWELL, Aloha Gell      Created: Wed Mar 07, 2014 12:06 PM       Reviewed. AST and ALT are normal. LDL 51. Please let him know to continue same medical therapy. ------

## 2014-04-13 ENCOUNTER — Other Ambulatory Visit: Payer: Self-pay | Admitting: *Deleted

## 2014-04-13 MED ORDER — CARVEDILOL 6.25 MG PO TABS: 6.2500 mg | ORAL_TABLET | Freq: Two times a day (BID) | ORAL | Status: DC

## 2014-07-26 ENCOUNTER — Other Ambulatory Visit: Payer: Self-pay | Admitting: *Deleted

## 2014-07-26 MED ORDER — SIMVASTATIN 40 MG PO TABS: 40.0000 mg | ORAL_TABLET | Freq: Every day | ORAL | Status: DC

## 2014-09-11 ENCOUNTER — Encounter: Payer: Self-pay | Admitting: Cardiology

## 2014-09-11 ENCOUNTER — Ambulatory Visit (INDEPENDENT_AMBULATORY_CARE_PROVIDER_SITE_OTHER): Payer: BC Managed Care – PPO | Admitting: Cardiology

## 2014-09-11 VITALS — BP 133/73 | HR 45 | Ht 71.0 in | Wt 173.0 lb

## 2014-09-11 DIAGNOSIS — I10 Essential (primary) hypertension: Secondary | ICD-10-CM

## 2014-09-11 DIAGNOSIS — I498 Other specified cardiac arrhythmias: Secondary | ICD-10-CM

## 2014-09-11 DIAGNOSIS — I2510 Atherosclerotic heart disease of native coronary artery without angina pectoris: Secondary | ICD-10-CM | POA: Diagnosis not present

## 2014-09-11 DIAGNOSIS — E782 Mixed hyperlipidemia: Secondary | ICD-10-CM

## 2014-09-11 DIAGNOSIS — R001 Bradycardia, unspecified: Secondary | ICD-10-CM

## 2014-09-11 MED ORDER — NITROGLYCERIN 0.4 MG SL SUBL: 0.4000 mg | SUBLINGUAL_TABLET | SUBLINGUAL | Status: DC | PRN

## 2014-09-11 NOTE — Progress Notes (Signed)
    Clinical Summary Mr. Artman is a 73 y.o.male last seen in March. He reports no angina symptoms or progressive shortness of breath. Still working and also helping out with other family members in need. He reports compliance with his medications, has not had regular followup with Dr. Sherrie Sport.  Lab work from March showed normal LFTs, triglycerides 59, cholesterol 109, HDL 46, LDL 51.  Adenosine Cardiolite from April 2009 showed no evidence of ischemia, inferior wall scar, LVEF 53%. He has generally preferred to hold off followup ischemic testing in the subsequent years. This remains the case today.   Allergies  Allergen Reactions  . Diphenhydramine Hcl     REACTION: hyper/aggressive    Current Outpatient Prescriptions  Medication Sig Dispense Refill  . aspirin EC 81 MG tablet Take 81 mg by mouth daily.        . carvedilol (COREG) 6.25 MG tablet Take 1 tablet (6.25 mg total) by mouth 2 (two) times daily with a meal.  60 tablet  6  . nitroGLYCERIN (NITROSTAT) 0.4 MG SL tablet Place 1 tablet (0.4 mg total) under the tongue every 5 (five) minutes x 3 doses as needed.  25 tablet  3  . simvastatin (ZOCOR) 40 MG tablet Take 1 tablet (40 mg total) by mouth at bedtime.  30 tablet  6   No current facility-administered medications for this visit.    Past Medical History  Diagnosis Date  . Coronary atherosclerosis of native coronary artery     BMS RCA and circ 2/09, residual 75% LAD, LVEF 45%  . Hyperlipidemia   . Myocardial infarction     Out of hospital IMI  . Hodgkin's lymphoma     Stage IIIB  . Sinus bradycardia   . Deep venous thrombosis     Left upper extremity deep venous thrombosis  . Cardiomyopathy     EF 40-45%, by 2D echo, 2/09    Social History Mr. Latorre reports that he quit smoking about 22 years ago. His smoking use included Cigarettes. He has a 30 pack-year smoking history. He quit smokeless tobacco use about 8 years ago. His smokeless tobacco use included Chew. Mr.  Duarte has no alcohol history on file.  Review of Systems Other systems reviewed and negative.  Physical Examination Filed Vitals:   09/11/14 1600  BP: 133/73  Pulse: 45   Filed Weights   09/11/14 1600  Weight: 173 lb (78.472 kg)    Elderly male in no acute distress.  HEENT: Conjunctiva and lids normal, oropharynx clear with moist mucosa.  Neck: Supple, no elevated JVP or carotid bruits, no thyromegaly.  Lungs: Clear to auscultation, nonlabored breathing at rest.  Cardiac: Regular rate and rhythm, no S3 or significant systolic murmur, no pericardial rub.  Abdomen: Soft, nontender, bowel sounds present, no guarding or rebound.  Extremities: Trace edema, distal pulses 1-2+.  Skin: Warm and dry.  Musculoskeletal: No kyphosis.  Neuropsychiatric: Alert and oriented x3, affect appropriate.   Problem List and Plan   CORONARY ATHEROSCLEROSIS NATIVE CORONARY ARTERY Symptomatically stable on medical therapy. Continue observation, no changes made today. He prefers a conservative approach overall without followup ischemic testing at this time.  Mixed hyperlipidemia Continues on Zocor. Followup FLP and LFT for next visit.  Essential hypertension, benign No change in current medications.  Sinus bradycardia Asymptomatic.    Satira Sark, M.D., F.A.C.C.

## 2014-09-11 NOTE — Assessment & Plan Note (Signed)
No change in current medications.

## 2014-09-11 NOTE — Assessment & Plan Note (Signed)
Asymptomatic. 

## 2014-09-11 NOTE — Assessment & Plan Note (Signed)
Symptomatically stable on medical therapy. Continue observation, no changes made today. He prefers a conservative approach overall without followup ischemic testing at this time.

## 2014-09-11 NOTE — Assessment & Plan Note (Signed)
Continues on Zocor. Followup FLP and LFT for next visit.

## 2014-09-11 NOTE — Patient Instructions (Signed)

## 2014-11-14 ENCOUNTER — Other Ambulatory Visit: Payer: Self-pay | Admitting: *Deleted

## 2014-11-14 MED ORDER — CARVEDILOL 6.25 MG PO TABS
6.2500 mg | ORAL_TABLET | Freq: Two times a day (BID) | ORAL | Status: DC
Start: 2014-11-14 — End: 2015-03-06

## 2015-02-20 ENCOUNTER — Other Ambulatory Visit: Payer: Self-pay | Admitting: *Deleted

## 2015-02-20 MED ORDER — SIMVASTATIN 40 MG PO TABS
40.0000 mg | ORAL_TABLET | Freq: Every day | ORAL | Status: DC
Start: 2015-02-20 — End: 2015-03-06

## 2015-03-01 ENCOUNTER — Other Ambulatory Visit: Payer: Self-pay | Admitting: *Deleted

## 2015-03-01 DIAGNOSIS — Z5181 Encounter for therapeutic drug level monitoring: Secondary | ICD-10-CM

## 2015-03-01 DIAGNOSIS — E782 Mixed hyperlipidemia: Secondary | ICD-10-CM

## 2015-03-01 NOTE — Progress Notes (Signed)
Lab orders faxed to [facility] lab.

## 2015-03-04 DIAGNOSIS — Z5181 Encounter for therapeutic drug level monitoring: Secondary | ICD-10-CM | POA: Diagnosis not present

## 2015-03-04 DIAGNOSIS — E782 Mixed hyperlipidemia: Secondary | ICD-10-CM | POA: Diagnosis not present

## 2015-03-04 DIAGNOSIS — Z79899 Other long term (current) drug therapy: Secondary | ICD-10-CM | POA: Diagnosis not present

## 2015-03-06 ENCOUNTER — Encounter: Payer: Self-pay | Admitting: Cardiology

## 2015-03-06 ENCOUNTER — Ambulatory Visit (INDEPENDENT_AMBULATORY_CARE_PROVIDER_SITE_OTHER): Payer: BLUE CROSS/BLUE SHIELD | Admitting: Cardiology

## 2015-03-06 VITALS — BP 126/60 | HR 49 | Ht 71.0 in | Wt 172.0 lb

## 2015-03-06 DIAGNOSIS — E782 Mixed hyperlipidemia: Secondary | ICD-10-CM | POA: Diagnosis not present

## 2015-03-06 DIAGNOSIS — I10 Essential (primary) hypertension: Secondary | ICD-10-CM | POA: Diagnosis not present

## 2015-03-06 DIAGNOSIS — I2510 Atherosclerotic heart disease of native coronary artery without angina pectoris: Secondary | ICD-10-CM | POA: Diagnosis not present

## 2015-03-06 MED ORDER — CARVEDILOL 3.125 MG PO TABS: 3.1250 mg | ORAL_TABLET | Freq: Two times a day (BID) | ORAL | Status: DC

## 2015-03-06 MED ORDER — SIMVASTATIN 20 MG PO TABS: 20.0000 mg | ORAL_TABLET | Freq: Every day | ORAL | Status: DC

## 2015-03-06 NOTE — Patient Instructions (Signed)
Your physician recommends that you schedule a follow-up appointment in: 6 months. You will receive a reminder letter in the mail in about 4 months reminding you to call and schedule your appointment. If you don't receive this letter, please contact our office. Your physician has recommended you make the following change in your medication:  Decrease your carvedilol to 3.125 mg twice daily. You may break your 6.25 mg tablet in half twice daily until they are finished. Decrease your simvastatin to 20 mg daily. You may break your 40 mg tablet in half daily until they are finished. Continue all other medications the same.

## 2015-03-06 NOTE — Progress Notes (Signed)
Cardiology Office Note  Date: 03/06/2015   ID: [name], DOB [birth year], MRN [id number]  PCP: Neale Burly, MD  Primary Cardiologist: Rozann Lesches, MD   Chief Complaint  Patient presents with  . Coronary Artery Disease  . Cardiomyopathy  . Hyperlipidemia    History of Present Illness: Danny [name] is a 74 y.o. male last seen in September 2015. He presents for a routine visit. Still works at [employer] as a Radiation protection practitioner. Stays busy outdoors as well. He reports no angina symptoms or increasing shortness of breath. He has had no episodes of dizziness or palpitations.  Follow-up lipids are noted below, relatively low numbers with LDL 40. I recommended that he cut his Zocor back to 20 mg daily.  Adenosine Cardiolite from April 2009 showed no evidence of ischemia, inferior wall scar, LVEF 53%. He has preferred to hold off followup ischemic testing in the subsequent years. We discussed follow-up testing today, he continues to prefer observation unless things change. Follow-up tracing is reviewed below.  Past Medical History  Diagnosis Date  . Coronary atherosclerosis of native coronary artery     BMS RCA and circ 2/09, residual 75% LAD, LVEF 45%  . Hyperlipidemia   . Myocardial infarction     Out of hospital IMI  . Hodgkin's lymphoma     Stage IIIB  . Sinus bradycardia   . Deep venous thrombosis     Left upper extremity deep venous thrombosis  . Cardiomyopathy     EF 40-45%, by 2D echo, 2/09    Current Outpatient Prescriptions  Medication Sig Dispense Refill  . aspirin EC 81 MG tablet Take 81 mg by mouth daily.      . carvedilol (COREG) 6.25 MG tablet Take 1 tablet (6.25 mg total) by mouth 2 (two) times daily with a meal. 60 tablet 6  . nitroGLYCERIN (NITROSTAT) 0.4 MG SL tablet Place 1 tablet (0.4 mg total) under the tongue every 5 (five) minutes x 3 doses as needed. 25 tablet 3  . simvastatin (ZOCOR) 40 MG tablet Take 1 tablet (40 mg total) by mouth at  bedtime. 30 tablet 6   No current facility-administered medications for this visit.    Allergies:  Diphenhydramine hcl   Social History: The patient  reports that he quit smoking about 23 years ago. His smoking use included Cigarettes. He has a 30 pack-year smoking history. He quit smokeless tobacco use about 9 years ago. His smokeless tobacco use included Chew.    ROS:  Please see the history of present illness. Otherwise, complete review of systems is positive for decreased hearing.  All other systems are reviewed and negative.    Physical Exam: VS:  There were no vitals taken for this visit., BMI There is no weight on file to calculate BMI.  Wt Readings from Last 3 Encounters:  09/11/14 173 lb (78.472 kg)  03/02/14 174 lb 12.8 oz (79.289 kg)  07/27/13 175 lb (79.379 kg)     Elderly male in no acute distress.  HEENT: Conjunctiva and lids normal, oropharynx clear with moist mucosa.  Neck: Supple, no elevated JVP or carotid bruits, no thyromegaly.  Lungs: Clear to auscultation, nonlabored breathing at rest.  Cardiac: Regular rate and rhythm, no S3 or significant systolic murmur, no pericardial rub.  Abdomen: Soft, nontender, bowel sounds present, no guarding or rebound.  Extremities: Trace edema, distal pulses 1-2+.  Skin: Warm and dry.  Musculoskeletal: No kyphosis.  Neuropsychiatric: Alert and oriented x3, affect appropriate.  ECG: ECG is ordered today and reviewed showing sinus bradycardia at 38 with old lateral infarct pattern.    Recent Labwork:  Lab work from 03/04/2015 showed AST 23, ALT 20, triglycerides 53, cholesterol 101, HDL 50, LDL 40.   Assessment and Plan:  1. CAD status post interventions as detailed above and associated with ischemic myopathy, LVEF 40-45%. He continues to prefer conservative approach with observation on medical therapy and no follow-up ischemic testing at this time. ECG is stable. I have asked him to reduce Coreg to 3.1250 g twice  daily in light of significant bradycardia.  2. Hyperlipidemia, Zocor will be reduced to 20 mg daily, recent LDL 40.  Current medicines are reviewed at length with the patient today. Both Coreg and Zocor doses are being reduced as outlined.   Disposition: FU with me in 6 months.   Signed, Satira Sark, MD, [facility] 03/06/2015 8:26 AM    [facility] [place], [place], [place] [address] Phone: [phone]; Fax: [phone]

## 2015-09-20 ENCOUNTER — Ambulatory Visit (INDEPENDENT_AMBULATORY_CARE_PROVIDER_SITE_OTHER): Payer: BLUE CROSS/BLUE SHIELD | Admitting: Cardiology

## 2015-09-20 ENCOUNTER — Encounter: Payer: Self-pay | Admitting: Cardiology

## 2015-09-20 VITALS — BP 137/74 | HR 46 | Ht 71.0 in | Wt 171.8 lb

## 2015-09-20 DIAGNOSIS — E782 Mixed hyperlipidemia: Secondary | ICD-10-CM

## 2015-09-20 DIAGNOSIS — I2510 Atherosclerotic heart disease of native coronary artery without angina pectoris: Secondary | ICD-10-CM

## 2015-09-20 NOTE — Patient Instructions (Signed)
Your physician recommends that you continue on your current medications as directed. Please refer to the Current Medication list given to you today. Your physician recommends that you schedule a follow-up appointment in: 6 months. You will receive a reminder letter in the mail in about 4 months reminding you to call and schedule your appointment. If you don't receive this letter, please contact our office. 

## 2015-09-20 NOTE — Progress Notes (Signed)
Cardiology Office Note  Date: 09/20/2015   ID: Danny [name], DOB Dec 31, 1940, MRN [id number]  PCP: Neale Burly, MD  Primary Cardiologist: Rozann Lesches, MD   Chief Complaint  Patient presents with  . Coronary Artery Disease    History of Present Illness: Danny [name] is a 74 y.o. male last seen in March. He presents for a routine follow-up visit. He reports no angina symptoms, and states that he has had no further episodes of lightheadedness. He continues to stay very active, works outdoors and also is still employed at [employer].  He continues on the medical regimen outlined below. We have reduced his Coreg and Zocor doses over time. No recent follow-up with Dr. Sherrie Sport.  Adenosine Cardiolite from April 2009 showed no evidence of ischemia, inferior wall scar, LVEF 53%. He has preferred to hold off followup ischemic testing in the subsequent years. We discussed follow-up testing today, he continues to prefer observation unless things change.  Past Medical History  Diagnosis Date  . Coronary atherosclerosis of native coronary artery     BMS RCA and circ 2/09, residual 75% LAD, LVEF 45%  . Hyperlipidemia   . Myocardial infarction     Out of hospital IMI  . Hodgkin's lymphoma     Stage IIIB  . Sinus bradycardia   . Deep venous thrombosis     Left upper extremity deep venous thrombosis  . Cardiomyopathy     EF 40-45%, by 2D echo, 2/09    Current Outpatient Prescriptions  Medication Sig Dispense Refill  . aspirin EC 81 MG tablet Take 81 mg by mouth daily.      . carvedilol (COREG) 3.125 MG tablet Take 1 tablet (3.125 mg total) by mouth 2 (two) times daily. 60 tablet 6  . nitroGLYCERIN (NITROSTAT) 0.4 MG SL tablet Place 1 tablet (0.4 mg total) under the tongue every 5 (five) minutes x 3 doses as needed. 25 tablet 3  . simvastatin (ZOCOR) 20 MG tablet Take 1 tablet (20 mg total) by mouth at bedtime. 30 tablet 6   No current facility-administered medications  for this visit.    Allergies:  Diphenhydramine hcl   Social History: The patient  reports that he quit smoking about 23 years ago. His smoking use included Cigarettes. He has a 30 pack-year smoking history. He quit smokeless tobacco use about 9 years ago. His smokeless tobacco use included Chew.   ROS:  Please see the history of present illness. Otherwise, complete review of systems is positive for arthritis.  All other systems are reviewed and negative.   Physical Exam: VS:  BP 137/74 mmHg  Pulse 46  Ht 5\' 11"  (1.803 m)  Wt 171 lb 12.8 oz (77.928 kg)  BMI 23.97 kg/m2  SpO2 97%, BMI Body mass index is 23.97 kg/(m^2).  Wt Readings from Last 3 Encounters:  09/20/15 171 lb 12.8 oz (77.928 kg)  03/06/15 172 lb (78.019 kg)  09/11/14 173 lb (78.472 kg)     Elderly male in no acute distress.  HEENT: Conjunctiva and lids normal, oropharynx clear with moist mucosa.  Neck: Supple, no elevated JVP or carotid bruits, no thyromegaly.  Lungs: Clear to auscultation, nonlabored breathing at rest.  Cardiac: Regular rate and rhythm, no S3 or significant systolic murmur, no pericardial rub.  Abdomen: Soft, nontender, bowel sounds present, no guarding or rebound.  Extremities: Trace edema, distal pulses 1-2+.  Skin: Warm and dry.  Musculoskeletal: No kyphosis.  Neuropsychiatric: Alert and oriented x3, affect  appropriate.   ECG: Tracing from 03/06/2015 showed sinus bradycardia at 46 bpm.   Recent Labwork:  March 2016: AST 23, ALT 20, triglycerides 53, cholesterol 101, HDL 50, LDL 40  Assessment and Plan:  1. CAD status post BMS to the RCA and circumflex in 2009 with residual 75% LAD disease. He remains symptomatically stable medical therapy with good functional capacity. Also continues to prefer conservative management with no follow-up ischemic testing unless symptoms intervene.  2. Hyperlipidemia, on Zocor, LDL was 40 earlier in the year.  Current medicines were reviewed with the  patient today.  Disposition: FU with me in 6 months.   Signed, Satira Sark, MD, [facility] 09/20/2015 2:57 PM    [facility] [place], [place], [place] [address] Phone: [phone]; Fax: [phone]

## 2015-10-04 ENCOUNTER — Ambulatory Visit (INDEPENDENT_AMBULATORY_CARE_PROVIDER_SITE_OTHER): Payer: Worker's Compensation

## 2015-10-04 ENCOUNTER — Encounter: Payer: Self-pay | Admitting: Pediatrics

## 2015-10-04 ENCOUNTER — Ambulatory Visit (INDEPENDENT_AMBULATORY_CARE_PROVIDER_SITE_OTHER): Payer: Worker's Compensation | Admitting: Pediatrics

## 2015-10-04 VITALS — BP 156/76 | HR 48 | Temp 97.3°F | Ht 71.0 in | Wt 174.4 lb

## 2015-10-04 DIAGNOSIS — M25552 Pain in left hip: Secondary | ICD-10-CM | POA: Diagnosis not present

## 2015-10-04 NOTE — Patient Instructions (Signed)
Ice 3-4 times a day for 15 minutes Tylenol as needed

## 2015-10-04 NOTE — Progress Notes (Signed)
    Subjective:    Patient ID: Danny [name], male    DOB: [birth year], 74 y.o.   MRN: [id number]  CC: fall onto L hip  HPI: Danny [name] is a 74 y.o. male presenting on 10/04/2015 for work comp after a fall at work.  Was packing a box on the floor, foot and knee stuck in the box, he fell backwards onto L buttock onto a cement floor. He denies hitting his head or any other body part.  No pops or cracks Got up right afterward, Kept working until the end shift Now with pain in L buttock, is able to weight bear and walk. SItting on area hurts.    Relevant past medical, surgical, family and social history reviewed and updated as indicated. Interim medical history since our last visit reviewed. Allergies and medications reviewed and updated.   ROS: Per HPI unless specifically indicated above  Past Medical History Patient Active Problem List   Diagnosis Date Noted  . Cardiomyopathy ([place])   . Near syncope 12/24/2011  . Sinus bradycardia 12/24/2011  . Essential hypertension, benign 02/24/2010  . DIZZINESS 08/09/2009  . PEDAL EDEMA 08/09/2009  . Mixed hyperlipidemia 02/06/2009  . CORONARY ATHEROSCLEROSIS NATIVE CORONARY ARTERY 02/06/2009    Current Outpatient Prescriptions  Medication Sig Dispense Refill  . aspirin EC 81 MG tablet Take 81 mg by mouth daily.      . carvedilol (COREG) 3.125 MG tablet Take 1 tablet (3.125 mg total) by mouth 2 (two) times daily. 60 tablet 6  . nitroGLYCERIN (NITROSTAT) 0.4 MG SL tablet Place 1 tablet (0.4 mg total) under the tongue every 5 (five) minutes x 3 doses as needed. 25 tablet 3  . simvastatin (ZOCOR) 20 MG tablet Take 1 tablet (20 mg total) by mouth at bedtime. 30 tablet 6   No current facility-administered medications for this visit.       Objective:    BP 156/76 mmHg  Pulse 48  Temp(Src) 97.3 F (36.3 C) (Oral)  Ht 5\' 11"  (1.803 m)  Wt 174 lb 6.4 oz (79.107 kg)  BMI 24.33 kg/m2  Wt Readings from Last 3 Encounters:  10/04/15  174 lb 6.4 oz (79.107 kg)  09/20/15 171 lb 12.8 oz (77.928 kg)  03/06/15 172 lb (78.019 kg)     Gen: NAD, alert, cooperative with exam, NCAT EYES: EOMI, no scleral injection or icterus CV: WWP Resp:  normal WOB Ext: No edema Neuro: Alert and oriented, strength equal b/l UE and LE, coordination grossly normal MSK: normal ROM of b/l hips, no limitation or pain. No pain with palpation over femoral heads. Swelling over soft tissue of entire  L buttock. Some tenderness with palpation. Normal cap refill, skin not tight.     Assessment & Plan:   Arihant was seen today for workers comp following a fall at work, found to have a large hematoma on L buttock after the fall. Normal cap refill over area, sensation intact. Pt on aspirin but no other blood thinners. Rec resting, ice, avoiding sitting on area. Will likely have bruising in area over next few days. No fractures on xray. Nl ROM b/l hips. Return precautions given.  Diagnoses and all orders for this visit:  Hip pain, acute, left -     DG HIP UNILAT W OR W/O PELVIS 2-3 VIEWS LEFT; Future   Follow up plan: As needed  Assunta Found, MD [facility] 10/04/2015, 5:14 PM

## 2015-10-07 ENCOUNTER — Ambulatory Visit: Payer: Self-pay | Admitting: Pediatrics

## 2015-10-08 ENCOUNTER — Other Ambulatory Visit: Payer: Self-pay | Admitting: Cardiology

## 2015-11-03 ENCOUNTER — Other Ambulatory Visit: Payer: Self-pay | Admitting: Cardiology

## 2016-05-28 ENCOUNTER — Ambulatory Visit (INDEPENDENT_AMBULATORY_CARE_PROVIDER_SITE_OTHER): Payer: BLUE CROSS/BLUE SHIELD | Admitting: Cardiology

## 2016-05-28 ENCOUNTER — Encounter: Payer: Self-pay | Admitting: Cardiology

## 2016-05-28 VITALS — BP 128/70 | HR 53 | Ht 71.0 in | Wt 168.4 lb

## 2016-05-28 DIAGNOSIS — Z5181 Encounter for therapeutic drug level monitoring: Secondary | ICD-10-CM | POA: Diagnosis not present

## 2016-05-28 DIAGNOSIS — E782 Mixed hyperlipidemia: Secondary | ICD-10-CM | POA: Diagnosis not present

## 2016-05-28 DIAGNOSIS — I2510 Atherosclerotic heart disease of native coronary artery without angina pectoris: Secondary | ICD-10-CM | POA: Diagnosis not present

## 2016-05-28 MED ORDER — SIMVASTATIN 20 MG PO TABS: 20.0000 mg | ORAL_TABLET | Freq: Every day | ORAL | Status: DC

## 2016-05-28 NOTE — Patient Instructions (Signed)
Your physician recommends that you continue on your current medications as directed. Please refer to the Current Medication list given to you today. Your physician recommends that you return for a FASTING lipid/liver profile. Please be fasting for at least 8 hours when you have this done. Your physician recommends that you schedule a follow-up appointment in: 6 months. You will receive a reminder letter in the mail in about 4 months reminding you to call and schedule your appointment. If you don't receive this letter, please contact our office.

## 2016-05-28 NOTE — Progress Notes (Signed)
Cardiology Office Note  Date: 05/28/2016   ID: Danny [name], DOB February 26, 1941, MRN [id number]  PCP: Neale Burly, MD  Primary Cardiologist: Rozann Lesches, MD   Chief Complaint  Patient presents with  . Coronary Artery Disease  . Cardiomyopathy    History of Present Illness: Danny [name] is a 75 y.o. male last seen in September 2016. He presents for a routine follow-up visit. States that he is very active with outdoor chores, does not report any angina symptoms or nitroglycerin use.  Adenosine Cardiolite from April 2009 showed no evidence of ischemia, inferior wall scar, LVEF 53%. He has preferred to hold off followup ischemic testing in the subsequent years. We discussed follow-up testing today, he continues to prefer observation unless things change.  We reviewed his medications. Zocor dosing was cut back after his last lipid panel showing LDL 40. He has not had follow-up lab work as yet. Does not see Dr. Sherrie Sport on a regular basis.  Past Medical History  Diagnosis Date  . Coronary atherosclerosis of native coronary artery     BMS RCA and circ 2/09, residual 75% LAD, LVEF 45%  . Hyperlipidemia   . Myocardial infarction [facility])     Out of hospital IMI  . Hodgkin's lymphoma ([place])     Stage IIIB  . Sinus bradycardia   . Deep venous thrombosis ([facility])     Left upper extremity deep venous thrombosis  . Cardiomyopathy ([place])     EF 40-45%, by 2D echo, 2/09    Past Surgical History  Procedure Laterality Date  . Port-a-cath placement      Current Outpatient Prescriptions  Medication Sig Dispense Refill  . aspirin EC 81 MG tablet Take 81 mg by mouth daily.      . carvedilol (COREG) 3.125 MG tablet TAKE ONE TABLET BY MOUTH TWICE DAILY 60 tablet 10  . nitroGLYCERIN (NITROSTAT) 0.4 MG SL tablet Place 1 tablet (0.4 mg total) under the tongue every 5 (five) minutes x 3 doses as needed. 25 tablet 3  . simvastatin (ZOCOR) 20 MG tablet Take 1 tablet (20 mg total) by mouth at  bedtime. 30 tablet 6   No current facility-administered medications for this visit.   Allergies:  Diphenhydramine hcl   Social History: The patient  reports that he quit smoking about 24 years ago. His smoking use included Cigarettes. He has a 30 pack-year smoking history. He quit smokeless tobacco use about 10 years ago. His smokeless tobacco use included Chew.   ROS:  Please see the history of present illness. Otherwise, complete review of systems is positive for arthritis.  All other systems are reviewed and negative.   Physical Exam: VS:  BP 128/70 mmHg  Pulse 53  Ht 5\' 11"  (1.803 m)  Wt 168 lb 6.4 oz (76.386 kg)  BMI 23.50 kg/m2  SpO2 97%, BMI Body mass index is 23.5 kg/(m^2).  Wt Readings from Last 3 Encounters:  05/28/16 168 lb 6.4 oz (76.386 kg)  10/04/15 174 lb 6.4 oz (79.107 kg)  09/20/15 171 lb 12.8 oz (77.928 kg)    Elderly male in no acute distress.  HEENT: Conjunctiva and lids normal, oropharynx clear with moist mucosa.  Neck: Supple, no elevated JVP or carotid bruits, no thyromegaly.  Lungs: Clear to auscultation, nonlabored breathing at rest.  Cardiac: Regular rate and rhythm, no S3 or significant systolic murmur, no pericardial rub.  Abdomen: Soft, nontender, bowel sounds present, no guarding or rebound.  Extremities: Trace edema, distal  pulses 1-2+.  Skin: Warm and dry.  Musculoskeletal: No kyphosis.  Neuropsychiatric: Alert and oriented x3, affect appropriate.  ECG: I personally reviewed the tracing from 03/06/2015 which showed sinus bradycardia with old anterolateral infarct pattern.  Recent Labwork:  March 2016: AST 23, ALT 20, triglycerides 53, cholesterol 101, HDL 50, LDL 40  Assessment and Plan:  1. CAD status post previous late presentation inferior wall infarct with BMS to the RCA and circumflex in 2009 and moderate residual LAD disease. He does not report any angina symptoms on medical therapy, and continues to prefer to hold off on  follow-up ischemic testing. ECG reviewed today shows sinus bradycardia.  2. Hyperlipidemia, continues on Zocor. Follow-up FLP and LFTs.  Current medicines were reviewed with the patient today.   Orders Placed This Encounter  Procedures  . Hepatic function panel  . Lipid panel  . EKG 12-Lead    Disposition: FU with me in 6 months.   Signed, Satira Sark, MD, [facility] 05/28/2016 4:03 PM    [facility] [place], [place], [place] [address] Phone: [phone]; Fax: [phone]

## 2016-06-06 DIAGNOSIS — Z5181 Encounter for therapeutic drug level monitoring: Secondary | ICD-10-CM | POA: Diagnosis not present

## 2016-06-06 DIAGNOSIS — E782 Mixed hyperlipidemia: Secondary | ICD-10-CM | POA: Diagnosis not present

## 2016-06-09 ENCOUNTER — Telehealth: Payer: Self-pay | Admitting: *Deleted

## 2016-06-09 MED ORDER — SIMVASTATIN 10 MG PO TABS: 10.0000 mg | ORAL_TABLET | Freq: Every day | ORAL | Status: DC

## 2016-06-09 NOTE — Telephone Encounter (Signed)
-----   Message from Satira Sark, MD sent at 06/08/2016  4:45 PM EDT ----- Results reviewed. AST and ALT are normal. Total cholesterol 113 and LDL 44. Could probably go ahead and cut Zocor back to 10 mg daily. A copy of this test should be forwarded to Neale Burly, MD.

## 2016-06-09 NOTE — Telephone Encounter (Signed)
Patient informed and verbalized understanding of plan. Copy sent to PCP 

## 2016-09-04 ENCOUNTER — Other Ambulatory Visit: Payer: Self-pay | Admitting: Cardiology

## 2017-04-12 DIAGNOSIS — H25813 Combined forms of age-related cataract, bilateral: Secondary | ICD-10-CM | POA: Diagnosis not present

## 2017-04-20 NOTE — Progress Notes (Signed)
Cardiology Office Note  Date: 04/21/2017   ID: Danny [name], DOB 1941-12-10, MRN [id number]  PCP: Neale Burly, MD  Primary Cardiologist: Rozann Lesches, MD   Chief Complaint  Patient presents with  . Coronary Artery Disease  . Cardiomyopathy    History of Present Illness: Danny [name] is a 76 y.o. male last seen in June 2017. He presents for a routine follow-up visit. Continues to do very well, no reported angina symptoms or nitroglycerin use. He still works full time at [employer]. He reports NYHA class II dyspnea, no palpitations or syncope.  We have discussed arranging follow-up stress testing over time, however he has preferred to hold off on this in light of no active angina. I personally reviewed his ECG today which shows sinus bradycardia with nonspecific T-wave changes.  Current cardiac regimen as outlined below and stable. He reports compliance with his medications.  He states that he will be having cataract surgery done soon at [facility].  Past Medical History:  Diagnosis Date  . Cardiomyopathy ([place])    EF 40-45%, by 2D echo, 2/09  . Coronary atherosclerosis of native coronary artery    BMS RCA and circ 2/09, residual 75% LAD, LVEF 45%  . Deep venous thrombosis (HCC)    Left upper extremity deep venous thrombosis  . Hodgkin's lymphoma ([place])    Stage IIIB  . Hyperlipidemia   . Myocardial infarction [facility])    Out of hospital IMI  . Sinus bradycardia     Past Surgical History:  Procedure Laterality Date  . PORT-A-CATH PLACEMENT      Current Outpatient Prescriptions  Medication Sig Dispense Refill  . aspirin EC 81 MG tablet Take 81 mg by mouth daily.      . carvedilol (COREG) 3.125 MG tablet TAKE ONE TABLET BY MOUTH TWICE DAILY 60 tablet 10  . nitroGLYCERIN (NITROSTAT) 0.4 MG SL tablet Place 1 tablet (0.4 mg total) under the tongue every 5 (five) minutes x 3 doses as needed. 25 tablet 3  . simvastatin (ZOCOR) 10 MG tablet Take 1 tablet (10  mg total) by mouth at bedtime. 90 tablet 3   No current facility-administered medications for this visit.    Allergies:  Diphenhydramine hcl   Social History: The patient  reports that he quit smoking about 25 years ago. His smoking use included Cigarettes. He has a 30.00 pack-year smoking history. He quit smokeless tobacco use about 11 years ago. His smokeless tobacco use included Chew.   ROS:  Please see the history of present illness. Otherwise, complete review of systems is positive for visual blurring related to his cataracts, arthritis stiffness.  All other systems are reviewed and negative.   Physical Exam: VS:  BP 140/62   Pulse (!) 50   Ht 5\' 11"  (1.803 m)   Wt 170 lb (77.1 kg)   SpO2 98%   BMI 23.71 kg/m , BMI Body mass index is 23.71 kg/m.  Wt Readings from Last 3 Encounters:  04/21/17 170 lb (77.1 kg)  05/28/16 168 lb 6.4 oz (76.4 kg)  10/04/15 174 lb 6.4 oz (79.1 kg)    Elderly male in no acute distress.  HEENT: Conjunctiva and lids normal, oropharynx clear.  Neck: Supple, no elevated JVP or carotid bruits, no thyromegaly.  Lungs: Clear to auscultation, nonlabored breathing at rest.  Cardiac: Regular rate and rhythm, no S3 or significant systolic murmur, no pericardial rub.  Abdomen: Soft, nontender, bowel sounds present, no guarding or rebound.  Extremities: No pitting edema, distal pulses 1-2+.  Skin: Warm and dry.  Musculoskeletal: No kyphosis.  Neuropsychiatric: Alert and oriented x3, affect appropriate.  ECG: I personally reviewed the tracing from 05/28/2016 which showed sinus bradycardia.  Recent Labwork:  June 2017: AST 17, ALT 14, triglycerides 44, cholesterol 113, HDL 60, LDL 44  Other Studies Reviewed Today:  Adenosine Cardiolite April 2009: No evidence of ischemia, inferior wall scar, LVEF 53%.   Assessment and Plan:  1. CAD status post BMS to the RCA and circumflex in 2009 with residual moderate LAD disease that has been managed  medically. He remains quite stable without active angina symptoms on medical therapy and describes good functional capacity. I reviewed his ECG which is stable. He prefers to hold off on follow-up ischemic testing and we will continue to follow him for any change in symptoms. LVEF was 53% by last assessment.  2. Hyperlipidemia, continues on Zocor, last LDL 44.  3. Patient reported plan for cataract surgery. Do not anticipate any further cardiac evaluation prior to this, he should be able to proceed at relatively low risk.  4. History of cardiomyopathy, last LVEF was low normal range. No evidence of volume overload.  Current medicines were reviewed with the patient today.   Orders Placed This Encounter  Procedures  . EKG 12-Lead    Disposition: Follow-up in one year.  Signed, Satira Sark, MD, [facility] 04/21/2017 3:31 PM    [facility] [place], [place], [place] [address] Phone: [phone]; Fax: [phone]

## 2017-04-21 ENCOUNTER — Encounter: Payer: Self-pay | Admitting: Cardiology

## 2017-04-21 ENCOUNTER — Ambulatory Visit (INDEPENDENT_AMBULATORY_CARE_PROVIDER_SITE_OTHER): Payer: BLUE CROSS/BLUE SHIELD | Admitting: Cardiology

## 2017-04-21 VITALS — BP 140/62 | HR 50 | Ht 71.0 in | Wt 170.0 lb

## 2017-04-21 DIAGNOSIS — E782 Mixed hyperlipidemia: Secondary | ICD-10-CM | POA: Diagnosis not present

## 2017-04-21 DIAGNOSIS — Z01810 Encounter for preprocedural cardiovascular examination: Secondary | ICD-10-CM | POA: Diagnosis not present

## 2017-04-21 DIAGNOSIS — Z8679 Personal history of other diseases of the circulatory system: Secondary | ICD-10-CM

## 2017-04-21 DIAGNOSIS — I2510 Atherosclerotic heart disease of native coronary artery without angina pectoris: Secondary | ICD-10-CM

## 2017-04-21 NOTE — Patient Instructions (Signed)

## 2017-04-21 NOTE — Patient Instructions (Signed)
Your procedure is scheduled on: 05/03/2017  Report to [facility] at  640  AM.  Call this number if you have problems the morning of surgery: [phone]   Do not eat food or drink liquids :After Midnight.      Take these medicines the morning of surgery with A SIP OF WATER: coreg.   Do not wear jewelry, make-up or nail polish.  Do not wear lotions, powders, or perfumes. You may wear deodorant.  Do not shave 48 hours prior to surgery.  Do not bring valuables to the hospital.  Contacts, dentures or bridgework may not be worn into surgery.  Leave suitcase in the car. After surgery it may be brought to your room.  For patients admitted to the hospital, checkout time is 11:00 AM the day of discharge.   Patients discharged the day of surgery will not be allowed to drive home.  :     Please read over the following fact sheets that you were given: Coughing and Deep Breathing, Surgical Site Infection Prevention, Anesthesia Post-op Instructions and Care and Recovery After Surgery    Cataract A cataract is a clouding of the lens of the eye. When a lens becomes cloudy, vision is reduced based on the degree and nature of the clouding. Many cataracts reduce vision to some degree. Some cataracts make people more near-sighted as they develop. Other cataracts increase glare. Cataracts that are ignored and become worse can sometimes look white. The white color can be seen through the pupil. CAUSES   Aging. However, cataracts may occur at any age, even in newborns.   Certain drugs.   Trauma to the eye.   Certain diseases such as diabetes.   Specific eye diseases such as chronic inflammation inside the eye or a sudden attack of a rare form of glaucoma.   Inherited or acquired medical problems.  SYMPTOMS   Gradual, progressive drop in vision in the affected eye.   Severe, rapid visual loss. This most often happens when trauma is the cause.  DIAGNOSIS  To detect a cataract, an eye doctor examines  the lens. Cataracts are best diagnosed with an exam of the eyes with the pupils enlarged (dilated) by drops.  TREATMENT  For an early cataract, vision may improve by using different eyeglasses or stronger lighting. If that does not help your vision, surgery is the only effective treatment. A cataract needs to be surgically removed when vision loss interferes with your everyday activities, such as driving, reading, or watching TV. A cataract may also have to be removed if it prevents examination or treatment of another eye problem. Surgery removes the cloudy lens and usually replaces it with a substitute lens (intraocular lens, IOL).  At a time when both you and your doctor agree, the cataract will be surgically removed. If you have cataracts in both eyes, only one is usually removed at a time. This allows the operated eye to heal and be out of danger from any possible problems after surgery (such as infection or poor wound healing). In rare cases, a cataract may be doing damage to your eye. In these cases, your caregiver may advise surgical removal right away. The vast majority of people who have cataract surgery have better vision afterward. HOME CARE INSTRUCTIONS  If you are not planning surgery, you may be asked to do the following:  Use different eyeglasses.   Use stronger or brighter lighting.   Ask your eye doctor about reducing your medicine dose or  changing medicines if it is thought that a medicine caused your cataract. Changing medicines does not make the cataract go away on its own.   Become familiar with your surroundings. Poor vision can lead to injury. Avoid bumping into things on the affected side. You are at a higher risk for tripping or falling.   Exercise extreme care when driving or operating machinery.   Wear sunglasses if you are sensitive to bright light or experiencing problems with glare.  SEEK IMMEDIATE MEDICAL CARE IF:   You have a worsening or sudden vision loss.    You notice redness, swelling, or increasing pain in the eye.   You have a fever.  Document Released: 12/07/2005 Document Revised: 11/26/2011 Document Reviewed: 07/31/2011 [facility] Patient Information 2012 [place].PATIENT INSTRUCTIONS POST-ANESTHESIA  IMMEDIATELY FOLLOWING SURGERY:  Do not drive or operate machinery for the first twenty four hours after surgery.  Do not make any important decisions for twenty four hours after surgery or while taking narcotic pain medications or sedatives.  If you develop intractable nausea and vomiting or a severe headache please notify your doctor immediately.  FOLLOW-UP:  Please make an appointment with your surgeon as instructed. You do not need to follow up with anesthesia unless specifically instructed to do so.  WOUND CARE INSTRUCTIONS (if applicable):  Keep a dry clean dressing on the anesthesia/puncture wound site if there is drainage.  Once the wound has quit draining you may leave it open to air.  Generally you should leave the bandage intact for twenty four hours unless there is drainage.  If the epidural site drains for more than 36-48 hours please call the anesthesia department.  QUESTIONS?:  Please feel free to call your physician or the hospital operator if you have any questions, and they will be happy to assist you.

## 2017-04-26 ENCOUNTER — Encounter (HOSPITAL_COMMUNITY): Payer: Self-pay

## 2017-04-26 ENCOUNTER — Encounter (HOSPITAL_COMMUNITY)
Admission: RE | Admit: 2017-04-26 | Discharge: 2017-04-26 | Disposition: A | Discharge status: Home / Self Care | Payer: BLUE CROSS/BLUE SHIELD | Source: Ambulatory Visit | Attending: Ophthalmology | Admitting: Ophthalmology

## 2017-04-26 DIAGNOSIS — R001 Bradycardia, unspecified: Secondary | ICD-10-CM | POA: Insufficient documentation

## 2017-04-26 DIAGNOSIS — I10 Essential (primary) hypertension: Secondary | ICD-10-CM | POA: Diagnosis not present

## 2017-04-26 DIAGNOSIS — I429 Cardiomyopathy, unspecified: Secondary | ICD-10-CM | POA: Diagnosis not present

## 2017-04-26 DIAGNOSIS — R42 Dizziness and giddiness: Secondary | ICD-10-CM | POA: Diagnosis not present

## 2017-04-26 DIAGNOSIS — R55 Syncope and collapse: Secondary | ICD-10-CM | POA: Diagnosis not present

## 2017-04-26 DIAGNOSIS — R609 Edema, unspecified: Secondary | ICD-10-CM | POA: Insufficient documentation

## 2017-04-26 DIAGNOSIS — Z01812 Encounter for preprocedural laboratory examination: Secondary | ICD-10-CM | POA: Insufficient documentation

## 2017-04-26 DIAGNOSIS — I2510 Atherosclerotic heart disease of native coronary artery without angina pectoris: Secondary | ICD-10-CM | POA: Diagnosis not present

## 2017-04-26 DIAGNOSIS — E782 Mixed hyperlipidemia: Secondary | ICD-10-CM | POA: Insufficient documentation

## 2017-04-26 LAB — CBC WITH DIFFERENTIAL/PLATELET
BASOS PCT: 0 %
Basophils Absolute: 0 10*3/uL (ref 0.0–0.1)
Eosinophils Absolute: 0.2 10*3/uL (ref 0.0–0.7)
Eosinophils Relative: 3 %
HEMATOCRIT: 47.6 % (ref 39.0–52.0)
HEMOGLOBIN: 15.7 g/dL (ref 13.0–17.0)
LYMPHS PCT: 30 %
Lymphs Abs: 2.5 10*3/uL (ref 0.7–4.0)
MCH: 33.6 pg (ref 26.0–34.0)
MCHC: 33 g/dL (ref 30.0–36.0)
MCV: 101.9 fL — ABNORMAL HIGH (ref 78.0–100.0)
MONOS PCT: 7 %
Monocytes Absolute: 0.6 10*3/uL (ref 0.1–1.0)
NEUTROS ABS: 5.2 10*3/uL (ref 1.7–7.7)
NEUTROS PCT: 60 %
Platelets: 215 10*3/uL (ref 150–400)
RBC: 4.67 MIL/uL (ref 4.22–5.81)
RDW: 12.8 % (ref 11.5–15.5)
WBC: 8.6 10*3/uL (ref 4.0–10.5)

## 2017-04-26 LAB — BASIC METABOLIC PANEL
ANION GAP: 9 (ref 5–15)
BUN: 18 mg/dL (ref 6–20)
CHLORIDE: 108 mmol/L (ref 101–111)
CO2: 24 mmol/L (ref 22–32)
Calcium: 9 mg/dL (ref 8.9–10.3)
Creatinine, Ser: 0.87 mg/dL (ref 0.61–1.24)
GFR calc non Af Amer: >60 mL/min (ref >60)
Glucose, Bld: 89 mg/dL (ref 65–99)
Potassium: 3.9 mmol/L (ref 3.5–5.1)
Sodium: 141 mmol/L (ref 135–145)

## 2017-04-30 ENCOUNTER — Encounter (HOSPITAL_COMMUNITY): Payer: Self-pay

## 2017-05-03 ENCOUNTER — Ambulatory Visit (HOSPITAL_COMMUNITY)
Admission: RE | Admit: 2017-05-03 | Discharge: 2017-05-03 | Disposition: A | Discharge status: Home / Self Care | Payer: BLUE CROSS/BLUE SHIELD | Source: Ambulatory Visit | Attending: Ophthalmology | Admitting: Ophthalmology

## 2017-05-03 ENCOUNTER — Encounter (HOSPITAL_COMMUNITY)
Admission: RE | Disposition: A | Discharge status: Home / Self Care | Payer: Self-pay | Source: Ambulatory Visit | Attending: Ophthalmology

## 2017-05-03 ENCOUNTER — Ambulatory Visit (HOSPITAL_COMMUNITY): Payer: BLUE CROSS/BLUE SHIELD | Admitting: Anesthesiology

## 2017-05-03 ENCOUNTER — Encounter (HOSPITAL_COMMUNITY): Payer: Self-pay | Admitting: *Deleted

## 2017-05-03 DIAGNOSIS — I2510 Atherosclerotic heart disease of native coronary artery without angina pectoris: Secondary | ICD-10-CM | POA: Diagnosis not present

## 2017-05-03 DIAGNOSIS — Z87891 Personal history of nicotine dependence: Secondary | ICD-10-CM | POA: Diagnosis not present

## 2017-05-03 DIAGNOSIS — H25812 Combined forms of age-related cataract, left eye: Secondary | ICD-10-CM | POA: Insufficient documentation

## 2017-05-03 DIAGNOSIS — Z955 Presence of coronary angioplasty implant and graft: Secondary | ICD-10-CM | POA: Insufficient documentation

## 2017-05-03 DIAGNOSIS — H52202 Unspecified astigmatism, left eye: Secondary | ICD-10-CM | POA: Diagnosis not present

## 2017-05-03 DIAGNOSIS — I252 Old myocardial infarction: Secondary | ICD-10-CM | POA: Insufficient documentation

## 2017-05-03 DIAGNOSIS — Z79899 Other long term (current) drug therapy: Secondary | ICD-10-CM | POA: Insufficient documentation

## 2017-05-03 DIAGNOSIS — H269 Unspecified cataract: Secondary | ICD-10-CM | POA: Diagnosis not present

## 2017-05-03 DIAGNOSIS — I509 Heart failure, unspecified: Secondary | ICD-10-CM | POA: Insufficient documentation

## 2017-05-03 DIAGNOSIS — D7589 Other specified diseases of blood and blood-forming organs: Secondary | ICD-10-CM | POA: Diagnosis not present

## 2017-05-03 DIAGNOSIS — I110 Hypertensive heart disease with heart failure: Secondary | ICD-10-CM | POA: Diagnosis not present

## 2017-05-03 HISTORY — PX: CATARACT EXTRACTION W/PHACO: SHX586

## 2017-05-03 SURGERY — PHACOEMULSIFICATION, CATARACT, WITH IOL INSERTION
Anesthesia: Monitor Anesthesia Care | Site: Eye | Laterality: Left

## 2017-05-03 MED ORDER — PHENYLEPHRINE HCL 2.5 % OP SOLN
1.0000 [drp] | OPHTHALMIC | Status: AC
  Administered 2017-05-03 (×3): 1 [drp] via OPHTHALMIC

## 2017-05-03 MED ORDER — EPINEPHRINE PF 1 MG/ML IJ SOLN
INTRAOCULAR | Status: DC | PRN
  Administered 2017-05-03: 500 mL

## 2017-05-03 MED ORDER — MIDAZOLAM HCL 2 MG/2ML IJ SOLN
INTRAMUSCULAR | Status: AC
  Filled 2017-05-03: qty 2

## 2017-05-03 MED ORDER — FENTANYL CITRATE (PF) 100 MCG/2ML IJ SOLN
25.0000 ug | Freq: Once | INTRAMUSCULAR | Status: AC
  Administered 2017-05-03: 25 ug via INTRAVENOUS

## 2017-05-03 MED ORDER — FENTANYL CITRATE (PF) 100 MCG/2ML IJ SOLN
INTRAMUSCULAR | Status: AC
  Filled 2017-05-03: qty 2

## 2017-05-03 MED ORDER — CYCLOPENTOLATE-PHENYLEPHRINE 0.2-1 % OP SOLN
1.0000 [drp] | OPHTHALMIC | Status: AC
  Administered 2017-05-03 (×3): 1 [drp] via OPHTHALMIC

## 2017-05-03 MED ORDER — NEOMYCIN-POLYMYXIN-DEXAMETH 3.5-10000-0.1 OP SUSP
OPHTHALMIC | Status: DC | PRN
  Administered 2017-05-03: 1 [drp] via OPHTHALMIC

## 2017-05-03 MED ORDER — LACTATED RINGERS IV SOLN
INTRAVENOUS | Status: DC
  Administered 2017-05-03: 08:00:00 via INTRAVENOUS

## 2017-05-03 MED ORDER — MIDAZOLAM HCL 2 MG/2ML IJ SOLN
1.0000 mg | INTRAMUSCULAR | Status: AC
  Administered 2017-05-03: 2 mg via INTRAVENOUS

## 2017-05-03 MED ORDER — POVIDONE-IODINE 5 % OP SOLN
OPHTHALMIC | Status: DC | PRN
  Administered 2017-05-03: 1 via OPHTHALMIC

## 2017-05-03 MED ORDER — PROVISC 10 MG/ML IO SOLN
INTRAOCULAR | Status: DC | PRN
  Administered 2017-05-03: 0.85 mL via INTRAOCULAR

## 2017-05-03 MED ORDER — LIDOCAINE HCL (PF) 1 % IJ SOLN
INTRAOCULAR | Status: DC | PRN
  Administered 2017-05-03: .8 mL via OPHTHALMIC

## 2017-05-03 MED ORDER — BSS IO SOLN
INTRAOCULAR | Status: DC | PRN
  Administered 2017-05-03: 15 mL via INTRAOCULAR

## 2017-05-03 MED ORDER — EPINEPHRINE PF 1 MG/ML IJ SOLN
INTRAMUSCULAR | Status: AC
  Filled 2017-05-03: qty 1

## 2017-05-03 MED ORDER — LIDOCAINE HCL 3.5 % OP GEL: 1.0000 "application " | Freq: Once | OPHTHALMIC | Status: DC

## 2017-05-03 MED ORDER — TETRACAINE HCL 0.5 % OP SOLN
1.0000 [drp] | OPHTHALMIC | Status: AC
  Administered 2017-05-03 (×3): 1 [drp] via OPHTHALMIC

## 2017-05-03 SURGICAL SUPPLY — 11 items
CLOTH BEACON ORANGE TIMEOUT ST (SAFETY) ×1 IMPLANT
EYE SHIELD UNIVERSAL CLEAR (GAUZE/BANDAGES/DRESSINGS) ×1 IMPLANT
GLOVE BIOGEL PI IND STRL 7.0 (GLOVE) IMPLANT
GLOVE BIOGEL PI INDICATOR 7.0 (GLOVE) ×2
LENS IOL ACRYSOF IQ TORIC 15.0 ×1 IMPLANT
PAD ARMBOARD 7.5X6 YLW CONV (MISCELLANEOUS) ×1 IMPLANT
PROC W SPEC LENS (INTRAOCULAR LENS) ×2
PROCESS W SPEC LENS (INTRAOCULAR LENS) IMPLANT
SYRINGE LUER LOK 1CC (MISCELLANEOUS) ×1 IMPLANT
TAPE TRANSPARENT 1/2IN (GAUZE/BANDAGES/DRESSINGS) ×1 IMPLANT
WATER STERILE IRR 250ML POUR (IV SOLUTION) ×1 IMPLANT

## 2017-05-03 NOTE — Anesthesia Postprocedure Evaluation (Signed)
Anesthesia Post Note  Patient: Danny [name]  Procedure(s) Performed: Procedure(s) (LRB): CATARACT EXTRACTION PHACO AND INTRAOCULAR LENS PLACEMENT (IOC) (Left)  Patient location during evaluation: Short Stay Anesthesia Type: MAC Level of consciousness: awake, oriented and patient cooperative Pain management: pain level controlled Vital Signs Assessment: post-procedure vital signs reviewed and stable Respiratory status: spontaneous breathing, nonlabored ventilation and respiratory function stable Cardiovascular status: blood pressure returned to baseline Postop Assessment: no signs of nausea or vomiting Anesthetic complications: no     Last Vitals:  Vitals:   05/03/17 0725  BP: (!) 154/76  Pulse: (!) 47  Temp: 36.4 C    Last Pain:  Vitals:   05/03/17 0725  TempSrc: Oral                 Vinisha Faxon J

## 2017-05-03 NOTE — Op Note (Signed)
Date of Admission: 05/03/2017  Date of Surgery: 05/03/2017  Pre-Op Dx: Cataract Left Eye  Post-Op Dx: Senile Combined Cataract Left Eye,  Dx Code H25.812, Astigmatism Left Eye, Dx Code H52.2  Surgeon: Tonny Branch, M.D.  Assistants: None  Anesthesia: Topical with MAC  Indications: Painless, progressive loss of vision with compromise of daily activities.  Surgery: Cataract Extraction with Intraocular lens Implant Left Eye  Discription: The patient had dilating drops and viscous lidocaine placed into the Left in the pre-op holding area. In the sitting position horizontal reference marks were made on the cornea.  After transfer to the operating room, a time out was performed. The patient was then prepped and draped. Beginning with a 36 degree blade a paracentesis port was made at the surgeon's 2 o'clock position. The anterior chamber was then filled with 1% non-preserved lidocaine. This was followed by filling the anterior chamber with Provisc. A 2.70m keratome blade was used to make a clear cornea incision at the temporal limbus. A bent cystatome needle was used to create a continuous tear capsulotomy. Hydrodissection was performed with balanced salt solution on a Fine canula. The lens nucleus was then removed using the phacoemulsification handpiece. Residual cortex was removed with the I&A handpiece. The anterior chamber and capsular bag were refilled with Provisc. A posterior chamber intraocular lens was placed into the capsular bag with it's injector. Additional corneal marks were made on the 006/186 degree meridians.  The Provisc was then removed from the anterior chamber and capsular bag with the I&A handpiece. The implant was positioned with the Kuglan hook. Stromal hydration of the main incision and paracentesis port was performed with BSS on a Fine canula. The wounds were tested for leak which was negative. The patient tolerated the procedure well. There were no operative complications. The  patient was then transferred to the recovery room in stable condition.  Complications: None  Specimen: None  EBL: None  Prosthetic device: AFranklin [id number], power23.0,  SN [id number]

## 2017-05-03 NOTE — Transfer of Care (Signed)
Immediate Anesthesia Transfer of Care Note  Patient: Danny [name]  Procedure(s) Performed: Procedure(s) with comments: CATARACT EXTRACTION PHACO AND INTRAOCULAR LENS PLACEMENT (IOC) (Left) - CDE: 13.15  Patient Location: Short Stay  Anesthesia Type:MAC  Level of Consciousness: awake  Airway & Oxygen Therapy: Patient Spontanous Breathing  Post-op Assessment: Report given to RN and Post -op Vital signs reviewed and stable  Post vital signs: Reviewed and stable  Last Vitals:  Vitals:   05/03/17 0725  BP: (!) 154/76  Pulse: (!) 47  Temp: 36.4 C    Last Pain:  Vitals:   05/03/17 0725  TempSrc: Oral      Patients Stated Pain Goal: 9 (92/33/00 7622)  Complications: No apparent anesthesia complications

## 2017-05-03 NOTE — Discharge Instructions (Signed)
Moderate Conscious Sedation, Adult, Care After °These instructions provide you with information about caring for yourself after your procedure. Your health care provider may also give you more specific instructions. Your treatment has been planned according to current medical practices, but problems sometimes occur. Call your health care provider if you have any problems or questions after your procedure. °What can I expect after the procedure? °After your procedure, it is common: °· To feel sleepy for several hours. °· To feel clumsy and have poor balance for several hours. °· To have poor judgment for several hours. °· To vomit if you eat too soon. °Follow these instructions at home: °For at least 24 hours after the procedure:  ° °· Do not: °¨ Participate in activities where you could fall or become injured. °¨ Drive. °¨ Use heavy machinery. °¨ Drink alcohol. °¨ Take sleeping pills or medicines that cause drowsiness. °¨ Make important decisions or sign legal documents. °¨ Take care of children on your own. °· Rest. °Eating and drinking  °· Follow the diet recommended by your health care provider. °· If you vomit: °¨ Drink water, juice, or soup when you can drink without vomiting. °¨ Make sure you have little or no nausea before eating solid foods. °General instructions  °· Have a responsible adult stay with you until you are awake and alert. °· Take over-the-counter and prescription medicines only as told by your health care provider. °· If you smoke, do not smoke without supervision. °· Keep all follow-up visits as told by your health care provider. This is important. °Contact a health care provider if: °· You keep feeling nauseous or you keep vomiting. °· You feel light-headed. °· You develop a rash. °· You have a fever. °Get help right away if: °· You have trouble breathing. °This information is not intended to replace advice given to you by your health care provider. Make sure you discuss any questions you  have with your health care provider. °Document Released: 09/27/2013 Document Revised: 05/11/2016 Document Reviewed: 03/28/2016 °Elsevier Interactive Patient Education © 2017 [employer] ° °

## 2017-05-03 NOTE — H&P (Signed)
I have reviewed the H&P, the patient was re-examined, and I have identified no interval changes in medical condition and plan of care since the history and physical of record  

## 2017-05-03 NOTE — Anesthesia Preprocedure Evaluation (Signed)
Anesthesia Evaluation  Patient identified by MRN, date of birth, ID band Patient awake    Reviewed: Allergy & Precautions, NPO status , Patient's Chart, lab work & pertinent test results, reviewed documented beta blocker date and time   Airway Mallampati: I  TM Distance: >3 FB Neck ROM: Full    Dental  (+) Implants, Teeth Intact,    Pulmonary former smoker,    breath sounds clear to auscultation       Cardiovascular hypertension, Pt. on home beta blockers and Pt. on medications + CAD, + Past MI, + Cardiac Stents and +CHF   Rhythm:Regular Rate:Normal     Neuro/Psych    GI/Hepatic negative GI ROS, Neg liver ROS,   Endo/Other  negative endocrine ROS  Renal/GU negative Renal ROS     Musculoskeletal   Abdominal   Peds  Hematology  (+) Blood dyscrasia ( Hodgkin's lymphoma ), ,   Anesthesia Other Findings   Reproductive/Obstetrics                             Anesthesia Physical Anesthesia Plan  ASA: III  Anesthesia Plan: MAC   Post-op Pain Management:    Induction: Intravenous  Airway Management Planned: Nasal Cannula  Additional Equipment:   Intra-op Plan:   Post-operative Plan:   Informed Consent: I have reviewed the patients History and Physical, chart, labs and discussed the procedure including the risks, benefits and alternatives for the proposed anesthesia with the patient or authorized representative who has indicated his/her understanding and acceptance.     Plan Discussed with:   Anesthesia Plan Comments:         Anesthesia Quick Evaluation

## 2017-05-04 ENCOUNTER — Encounter (HOSPITAL_COMMUNITY): Payer: Self-pay | Admitting: Ophthalmology

## 2017-06-07 ENCOUNTER — Encounter (HOSPITAL_COMMUNITY): Payer: Self-pay

## 2017-06-07 ENCOUNTER — Encounter (HOSPITAL_COMMUNITY)
Admission: RE | Admit: 2017-06-07 | Discharge: 2017-06-07 | Disposition: A | Discharge status: Home / Self Care | Payer: BLUE CROSS/BLUE SHIELD | Source: Ambulatory Visit | Attending: Ophthalmology | Admitting: Ophthalmology

## 2017-06-14 ENCOUNTER — Ambulatory Visit (HOSPITAL_COMMUNITY)
Admission: RE | Admit: 2017-06-14 | Discharge: 2017-06-14 | Disposition: A | Discharge status: Home / Self Care | Payer: BLUE CROSS/BLUE SHIELD | Source: Ambulatory Visit | Attending: Ophthalmology | Admitting: Ophthalmology

## 2017-06-14 ENCOUNTER — Ambulatory Visit (HOSPITAL_COMMUNITY): Payer: BLUE CROSS/BLUE SHIELD | Admitting: Anesthesiology

## 2017-06-14 ENCOUNTER — Encounter (HOSPITAL_COMMUNITY): Payer: Self-pay | Admitting: *Deleted

## 2017-06-14 ENCOUNTER — Encounter (HOSPITAL_COMMUNITY)
Admission: RE | Disposition: A | Discharge status: Home / Self Care | Payer: Self-pay | Source: Ambulatory Visit | Attending: Ophthalmology

## 2017-06-14 DIAGNOSIS — H25811 Combined forms of age-related cataract, right eye: Secondary | ICD-10-CM | POA: Diagnosis not present

## 2017-06-14 DIAGNOSIS — Z87891 Personal history of nicotine dependence: Secondary | ICD-10-CM | POA: Diagnosis not present

## 2017-06-14 DIAGNOSIS — H52201 Unspecified astigmatism, right eye: Secondary | ICD-10-CM | POA: Diagnosis not present

## 2017-06-14 DIAGNOSIS — I252 Old myocardial infarction: Secondary | ICD-10-CM | POA: Insufficient documentation

## 2017-06-14 HISTORY — PX: CATARACT EXTRACTION W/PHACO: SHX586

## 2017-06-14 SURGERY — PHACOEMULSIFICATION, CATARACT, WITH IOL INSERTION
Anesthesia: Monitor Anesthesia Care | Site: Eye | Laterality: Right

## 2017-06-14 MED ORDER — POVIDONE-IODINE 5 % OP SOLN
OPHTHALMIC | Status: DC | PRN
  Administered 2017-06-14: 1 via OPHTHALMIC

## 2017-06-14 MED ORDER — CYCLOPENTOLATE-PHENYLEPHRINE 0.2-1 % OP SOLN
1.0000 [drp] | OPHTHALMIC | Status: AC
  Administered 2017-06-14 (×3): 1 [drp] via OPHTHALMIC

## 2017-06-14 MED ORDER — LACTATED RINGERS IV SOLN
INTRAVENOUS | Status: DC
  Administered 2017-06-14: 09:00:00 via INTRAVENOUS

## 2017-06-14 MED ORDER — BSS IO SOLN
INTRAOCULAR | Status: DC | PRN
  Administered 2017-06-14: 15 mL

## 2017-06-14 MED ORDER — PHENYLEPHRINE HCL 2.5 % OP SOLN
1.0000 [drp] | OPHTHALMIC | Status: AC
  Administered 2017-06-14 (×3): 1 [drp] via OPHTHALMIC

## 2017-06-14 MED ORDER — NEOMYCIN-POLYMYXIN-DEXAMETH 3.5-10000-0.1 OP SUSP
OPHTHALMIC | Status: DC | PRN
  Administered 2017-06-14: 2 [drp] via OPHTHALMIC

## 2017-06-14 MED ORDER — EPINEPHRINE PF 1 MG/ML IJ SOLN
INTRAOCULAR | Status: DC | PRN
  Administered 2017-06-14: 500 mL

## 2017-06-14 MED ORDER — MIDAZOLAM HCL 2 MG/2ML IJ SOLN
1.0000 mg | Freq: Once | INTRAMUSCULAR | Status: AC | PRN
  Administered 2017-06-14: 2 mg via INTRAVENOUS
  Filled 2017-06-14: qty 2

## 2017-06-14 MED ORDER — TETRACAINE HCL 0.5 % OP SOLN
1.0000 [drp] | OPHTHALMIC | Status: AC
  Administered 2017-06-14 (×3): 1 [drp] via OPHTHALMIC

## 2017-06-14 MED ORDER — LIDOCAINE HCL 3.5 % OP GEL
1.0000 "application " | Freq: Once | OPHTHALMIC | Status: AC
  Administered 2017-06-14: 1 via OPHTHALMIC

## 2017-06-14 MED ORDER — LACTATED RINGERS IV SOLN
INTRAVENOUS | Status: DC
  Administered 2017-06-14: 08:00:00 via INTRAVENOUS

## 2017-06-14 MED ORDER — PROVISC 10 MG/ML IO SOLN
INTRAOCULAR | Status: DC | PRN
  Administered 2017-06-14: 0.85 mL via INTRAOCULAR

## 2017-06-14 MED ORDER — LIDOCAINE HCL (PF) 1 % IJ SOLN
INTRAMUSCULAR | Status: DC | PRN
  Administered 2017-06-14: .6 mL

## 2017-06-14 SURGICAL SUPPLY — 14 items
CLOTH BEACON ORANGE TIMEOUT ST (SAFETY) ×1 IMPLANT
EYE SHIELD UNIVERSAL CLEAR (GAUZE/BANDAGES/DRESSINGS) ×1 IMPLANT
GLOVE BIOGEL PI IND STRL 6.5 (GLOVE) IMPLANT
GLOVE BIOGEL PI IND STRL 7.0 (GLOVE) IMPLANT
GLOVE BIOGEL PI INDICATOR 6.5 (GLOVE) ×1
GLOVE BIOGEL PI INDICATOR 7.0 (GLOVE) ×2
LENS IOL ACRYSOF IQ TORIC 15.0 ×1 IMPLANT
PAD ARMBOARD 7.5X6 YLW CONV (MISCELLANEOUS) ×1 IMPLANT
PROC W SPEC LENS (INTRAOCULAR LENS) ×2
PROCESS W SPEC LENS (INTRAOCULAR LENS) IMPLANT
SYRINGE LUER LOK 1CC (MISCELLANEOUS) ×1 IMPLANT
TAPE SURG TRANSPORE 1 IN (GAUZE/BANDAGES/DRESSINGS) IMPLANT
TAPE SURGICAL TRANSPORE 1 IN (GAUZE/BANDAGES/DRESSINGS) ×1
WATER STERILE IRR 250ML POUR (IV SOLUTION) ×1 IMPLANT

## 2017-06-14 NOTE — Anesthesia Preprocedure Evaluation (Signed)
Anesthesia Evaluation  Patient identified by MRN, date of birth, ID band Patient awake    Airway Mallampati: I       Dental no notable dental hx.    Pulmonary neg pulmonary ROS, former smoker,    Pulmonary exam normal        Cardiovascular Exercise Tolerance: Good + Past MI   Rate:Bradycardia  Very low resting heart rate, no CP, no SOB, no syncope  Hx MI, stable   Neuro/Psych negative neurological ROS  negative psych ROS   GI/Hepatic negative GI ROS, Neg liver ROS,   Endo/Other    Renal/GU negative Renal ROS     Musculoskeletal negative musculoskeletal ROS (+)   Abdominal Normal abdominal exam  (+)   Peds negative pediatric ROS (+)  Hematology negative hematology ROS (+)   Anesthesia Other Findings   Reproductive/Obstetrics negative OB ROS                             Anesthesia Physical Anesthesia Plan  ASA: III  Anesthesia Plan: MAC   Post-op Pain Management:    Induction: Intravenous  PONV Risk Score and Plan:   Airway Management Planned: Nasal Cannula  Additional Equipment:   Intra-op Plan:   Post-operative Plan:   Informed Consent:   Plan Discussed with: CRNA  Anesthesia Plan Comments:         Anesthesia Quick Evaluation

## 2017-06-14 NOTE — Transfer of Care (Signed)
Immediate Anesthesia Transfer of Care Note  Patient: Danny [name]  Procedure(s) Performed: Procedure(s) with comments: CATARACT EXTRACTION PHACO AND INTRAOCULAR LENS PLACEMENT (IOC) (Right) - CDE: 10.58  Patient Location: Short Stay  Anesthesia Type:MAC  Level of Consciousness: awake, alert , oriented and patient cooperative  Airway & Oxygen Therapy: Patient Spontanous Breathing  Post-op Assessment: Report given to RN and Post -op Vital signs reviewed and stable  Post vital signs: Reviewed and stable  Last Vitals:  Vitals:   06/14/17 0905 06/14/17 0910  BP: 139/63 136/67  Resp: (!) 54 (!) 35  Temp:      Last Pain:  Vitals:   06/14/17 0734  TempSrc: Oral         Complications: No apparent anesthesia complications

## 2017-06-14 NOTE — H&P (Signed)
I have reviewed the H&P, the patient was re-examined, and I have identified no interval changes in medical condition and plan of care since the history and physical of record  

## 2017-06-14 NOTE — Anesthesia Procedure Notes (Signed)
Procedure Name: MAC Date/Time: 06/14/2017 9:13 AM Performed by: Andree Elk, AMY A Pre-anesthesia Checklist: Patient identified, Timeout performed, Emergency Drugs available, Suction available and Patient being monitored Oxygen Delivery Method: Nasal cannula

## 2017-06-14 NOTE — Discharge Instructions (Signed)
Moderate Conscious Sedation, Adult, Care After  These instructions provide you with information about caring for yourself after your procedure. Your health care provider may also give you more specific instructions. Your treatment has been planned according to current medical practices, but problems sometimes occur. Call your health care provider if you have any problems or questions after your procedure.  What can I expect after the procedure?  After your procedure, it is common:   To feel sleepy for several hours.   To feel clumsy and have poor balance for several hours.   To have poor judgment for several hours.   To vomit if you eat too soon.    Follow these instructions at home:  For at least 24 hours after the procedure:     Do not:  ? Participate in activities where you could fall or become injured.  ? Drive.  ? Use heavy machinery.  ? Drink alcohol.  ? Take sleeping pills or medicines that cause drowsiness.  ? Make important decisions or sign legal documents.  ? Take care of children on your own.   Rest.  Eating and drinking   Follow the diet recommended by your health care provider.   If you vomit:  ? Drink water, juice, or soup when you can drink without vomiting.  ? Make sure you have little or no nausea before eating solid foods.  General instructions   Have a responsible adult stay with you until you are awake and alert.   Take over-the-counter and prescription medicines only as told by your health care provider.   If you smoke, do not smoke without supervision.   Keep all follow-up visits as told by your health care provider. This is important.  Contact a health care provider if:   You keep feeling nauseous or you keep vomiting.   You feel light-headed.   You develop a rash.   You have a fever.  Get help right away if:   You have trouble breathing.  This information is not intended to replace advice given to you by your health care provider. Make sure you discuss any questions you have  with your health care provider.  Document Released: 09/27/2013 Document Revised: 05/11/2016 Document Reviewed: 03/28/2016  Elsevier Interactive Patient Education  2018 Elsevier Inc[employer]

## 2017-06-14 NOTE — Anesthesia Postprocedure Evaluation (Signed)
Anesthesia Post Note  Patient: Danny [name]  Procedure(s) Performed: Procedure(s) (LRB): CATARACT EXTRACTION PHACO AND INTRAOCULAR LENS PLACEMENT (IOC) (Right)  Patient location during evaluation: Short Stay Anesthesia Type: MAC Level of consciousness: awake and alert and oriented Pain management: pain level controlled Vital Signs Assessment: post-procedure vital signs reviewed and stable Respiratory status: spontaneous breathing Cardiovascular status: stable Postop Assessment: no signs of nausea or vomiting Anesthetic complications: no     Last Vitals:  Vitals:   06/14/17 0905 06/14/17 0910  BP: 139/63 136/67  Resp: (!) 54 (!) 35  Temp:      Last Pain:  Vitals:   06/14/17 0734  TempSrc: Oral                 ADAMS, AMY A

## 2017-06-14 NOTE — Op Note (Signed)
Date of Admission: 06/14/2017  Date of Surgery: 06/14/2017  Pre-Op Dx: Cataract Right Eye  Post-Op Dx: Senile Combined Cataract Right Eye,  Dx Code H25.811, Astigmatism Right Eye, Dx Code H52.2  Surgeon: Tonny Branch, M.D.  Assistants: None  Anesthesia: Topical with MAC  Indications: Painless, progressive loss of vision with compromise of daily activities.  Surgery: Cataract Extraction with Intraocular lens Implant Right Eye  Discription: The patient had dilating drops and viscous lidocaine placed into the Right in the pre-op holding area. In the sitting position horizontal reference marks were made on the cornea.  After transfer to the operating room, a time out was performed. The patient was then prepped and draped. Beginning with a 60 degree blade a paracentesis port was made at the surgeon's 2 o'clock position. The anterior chamber was then filled with 1% non-preserved lidocaine. This was followed by filling the anterior chamber with Provisc. A 2.68m keratome blade was used to make a clear cornea incision at the temporal limbus. A bent cystatome needle was used to create a continuous tear capsulotomy. Hydrodissection was performed with balanced salt solution on a Fine canula. The lens nucleus was then removed using the phacoemulsification handpiece. Residual cortex was removed with the I&A handpiece. The anterior chamber and capsular bag were refilled with Provisc. A posterior chamber intraocular lens was placed into the capsular bag with it's injector. Additional corneal marks were made on the 003/183 degree meridians.  The Provisc was then removed from the anterior chamber and capsular bag with the I&A handpiece. The implant was positioned with the Kuglan hook. Stromal hydration of the main incision and paracentesis port was performed with BSS on a Fine canula. The wounds were tested for leak which was negative. The patient tolerated the procedure well. There were no operative complications. The  patient was then transferred to the recovery room in stable condition.  Complications: None  Specimen: None  EBL: None  Prosthetic device: A[employer]lin [id number], power23.0,  SN [id number]

## 2017-06-15 ENCOUNTER — Encounter (HOSPITAL_COMMUNITY): Payer: Self-pay | Admitting: Ophthalmology

## 2017-06-15 NOTE — Addendum Note (Signed)
Addendum  created 06/15/17 0817 by Mikey College, MD   Anesthesia Attestations filed

## 2017-07-13 ENCOUNTER — Other Ambulatory Visit: Payer: Self-pay | Admitting: Cardiology

## 2017-07-15 IMAGING — CR DG HIP (WITH OR WITHOUT PELVIS) 2-3V*L*
3 series · 3 of 3 positions shown · non-contrast
Comparison: 06/13/2008 PET. Diagnostic CT of the chest abdomen
pelvis of 12/02/2011

CLINICAL DATA: Fell this morning; Lt hip pain

EXAM:
DG HIP (WITH OR WITHOUT PELVIS) 2-3V LEFT

[view not recorded (1 of 3)]
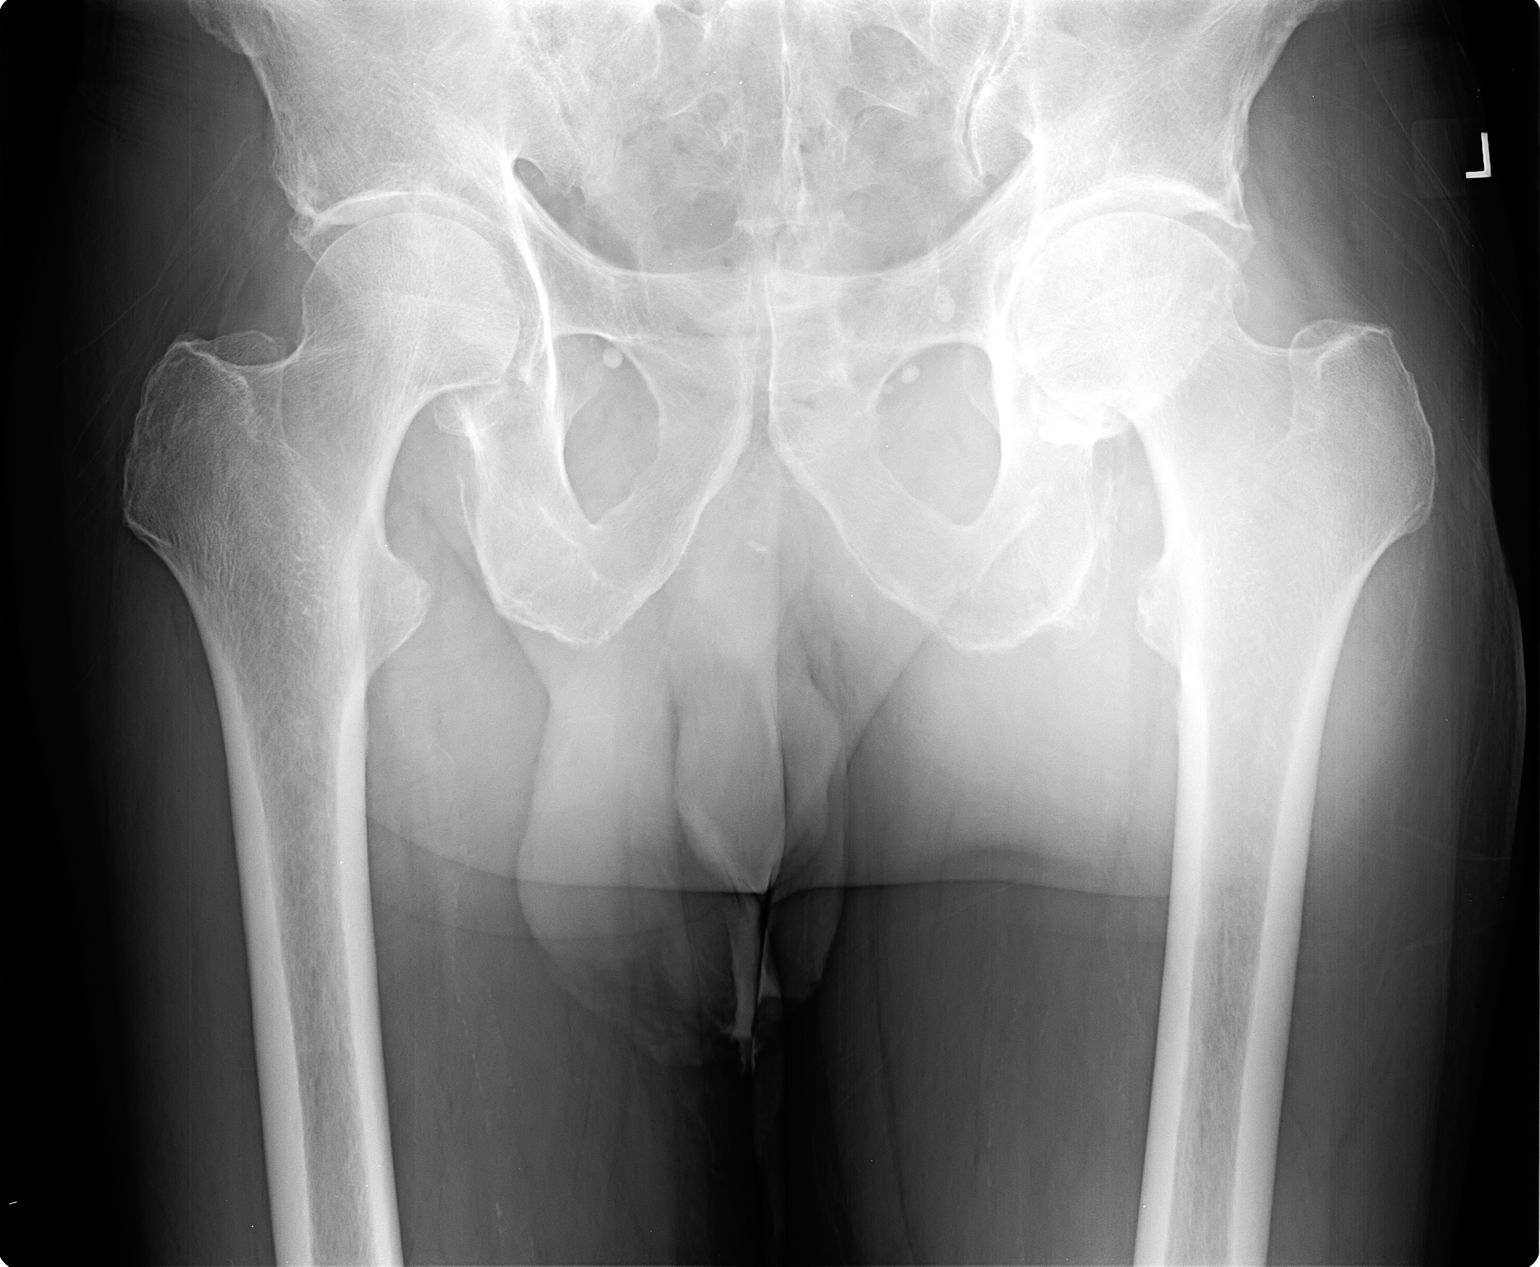

[view not recorded (2 of 3)]
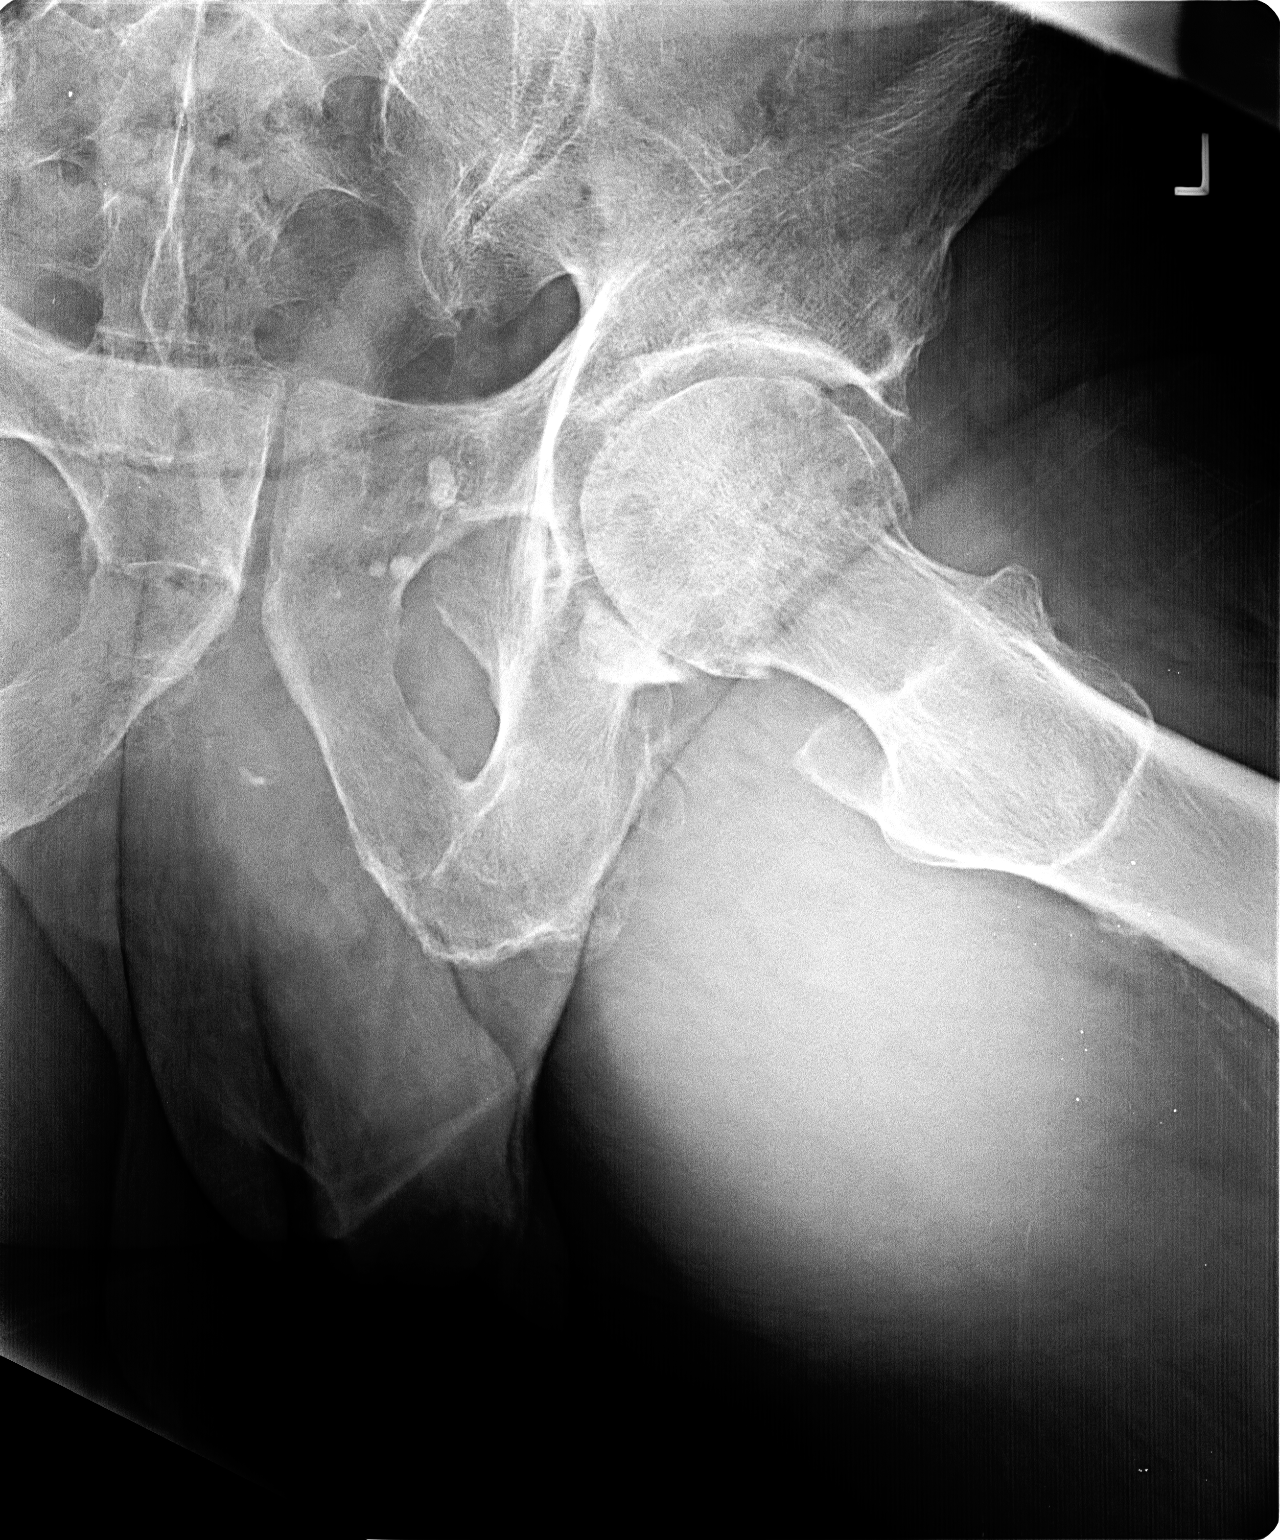

[view not recorded (3 of 3)]
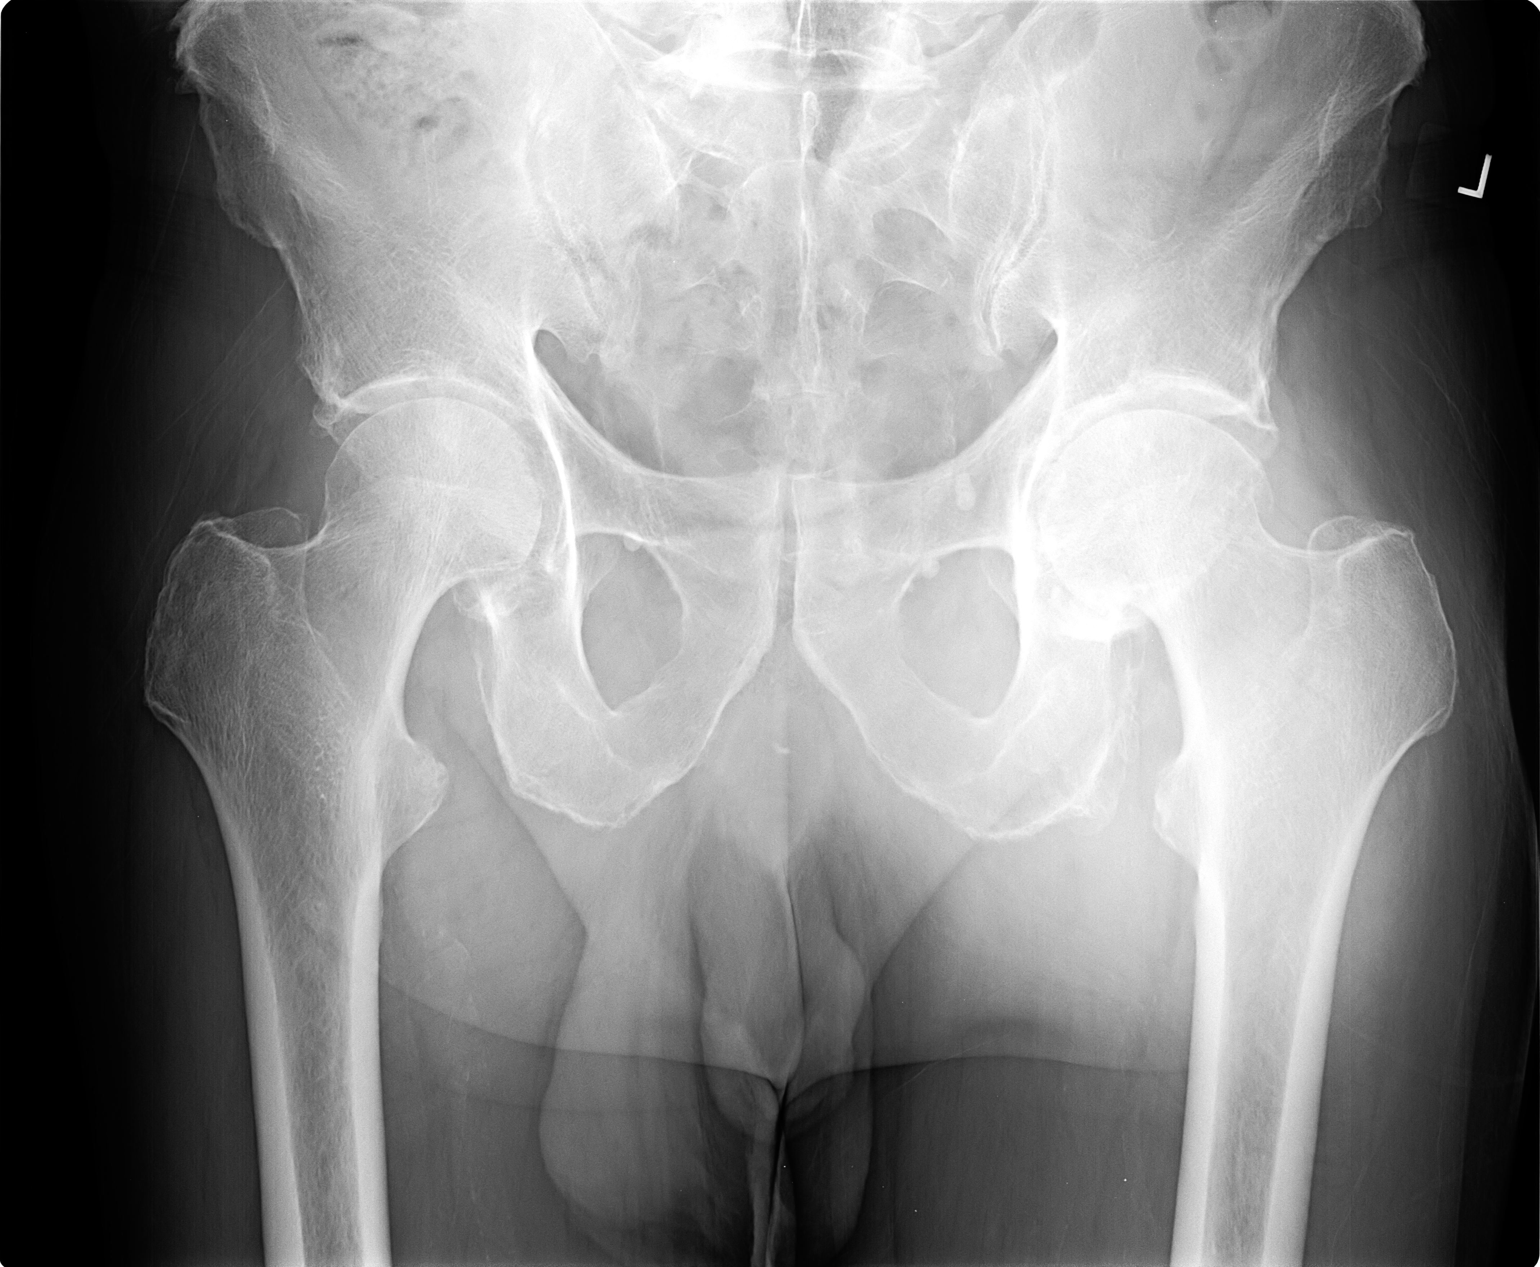

[3 of 3 positions shown; findings below may reference images not displayed]

FINDINGS: Femoral heads are located. Mild left and minimal right
osteoarthritis with joint space narrowing and osteophyte formation.
No acute fracture.
IMPRESSION: Degenerative change, without acute osseous finding.

## 2017-08-01 ENCOUNTER — Other Ambulatory Visit: Payer: Self-pay | Admitting: Cardiology

## 2017-09-11 DIAGNOSIS — Z6822 Body mass index (BMI) 22.0-22.9, adult: Secondary | ICD-10-CM | POA: Diagnosis not present

## 2017-09-11 DIAGNOSIS — T7840XA Allergy, unspecified, initial encounter: Secondary | ICD-10-CM | POA: Diagnosis not present

## 2018-04-25 ENCOUNTER — Encounter: Payer: Self-pay | Admitting: Cardiology

## 2018-04-25 ENCOUNTER — Ambulatory Visit (INDEPENDENT_AMBULATORY_CARE_PROVIDER_SITE_OTHER): Payer: BLUE CROSS/BLUE SHIELD | Admitting: Cardiology

## 2018-04-25 VITALS — BP 128/68 | HR 55 | Ht 71.0 in | Wt 162.0 lb

## 2018-04-25 DIAGNOSIS — I2510 Atherosclerotic heart disease of native coronary artery without angina pectoris: Secondary | ICD-10-CM | POA: Diagnosis not present

## 2018-04-25 DIAGNOSIS — Z8679 Personal history of other diseases of the circulatory system: Secondary | ICD-10-CM | POA: Diagnosis not present

## 2018-04-25 DIAGNOSIS — E782 Mixed hyperlipidemia: Secondary | ICD-10-CM | POA: Diagnosis not present

## 2018-04-25 NOTE — Patient Instructions (Signed)

## 2018-04-25 NOTE — Progress Notes (Signed)
Cardiology Office Note  Date: 04/25/2018   ID: Danny [name], DOB 1941-02-15, MRN [id number]  PCP: Neale Burly, MD  Primary Cardiologist: Rozann Lesches, MD   Chief Complaint  Patient presents with  . Coronary Artery Disease    History of Present Illness: Danny [name] is a 77 y.o. male last seen in May 2018.  He presents for a routine visit.  He tells me that he feels well, still working full-time, enjoys being outdoors and doing hard work.  He does not have any angina symptoms and has not required any nitroglycerin.  I personally reviewed his ECG today which shows sinus bradycardia with nonspecific T wave changes.  Current cardiac regimen includes aspirin, Coreg, Zocor, and as needed nitroglycerin.  Past Medical History:  Diagnosis Date  . Cardiomyopathy ([place])    EF 40-45%, by 2D echo, 2/09  . Coronary atherosclerosis of native coronary artery    BMS RCA and circ 2/09, residual 75% LAD, LVEF 45%  . Deep venous thrombosis (HCC)    Left upper extremity deep venous thrombosis  . Hodgkin's lymphoma ([place]) 2007   Stage IIIB  . Hyperlipidemia   . Myocardial infarction [facility]) 2006   Out of hospital IMI  . Sinus bradycardia     Past Surgical History:  Procedure Laterality Date  . CATARACT EXTRACTION W/PHACO Left 05/03/2017   Procedure: CATARACT EXTRACTION PHACO AND INTRAOCULAR LENS PLACEMENT (IOC);  Surgeon: Tonny Branch, MD;  Location: [facility];  Service: Ophthalmology;  Laterality: Left;  CDE: 13.15  . CATARACT EXTRACTION W/PHACO Right 06/14/2017   Procedure: CATARACT EXTRACTION PHACO AND INTRAOCULAR LENS PLACEMENT (IOC);  Surgeon: Tonny Branch, MD;  Location: [facility];  Service: Ophthalmology;  Laterality: Right;  CDE: 10.58  . CORONARY ANGIOPLASTY WITH STENT PLACEMENT  2009  . PORT-A-CATH PLACEMENT      Current Outpatient Medications  Medication Sig Dispense Refill  . aspirin EC 81 MG tablet Take 81 mg by mouth daily.      . carvedilol (COREG) 3.125 MG tablet TAKE ONE  TABLET BY MOUTH TWICE DAILY 60 tablet 11  . nitroGLYCERIN (NITROSTAT) 0.4 MG SL tablet Place 1 tablet (0.4 mg total) under the tongue every 5 (five) minutes x 3 doses as needed. (Patient taking differently: Place 0.4 mg under the tongue every 5 (five) minutes x 3 doses as needed for chest pain. ) 25 tablet 3  . simvastatin (ZOCOR) 10 MG tablet TAKE ONE TABLET BY MOUTH AT BEDTIME 90 tablet 3   No current facility-administered medications for this visit.    Allergies:  Diphenhydramine hcl   Social History: The patient  reports that he quit smoking about 26 years ago. His smoking use included cigarettes. He has a 30.00 pack-year smoking history. He quit smokeless tobacco use about 12 years ago. His smokeless tobacco use included chew. He reports that he does not drink alcohol or use drugs.   ROS:  Please see the history of present illness. Otherwise, complete review of systems is positive for arthritic stiffness.  All other systems are reviewed and negative.   Physical Exam: VS:  BP 128/68   Pulse (!) 55   Ht 5\' 11"  (1.803 m)   Wt 162 lb (73.5 kg)   SpO2 98%   BMI 22.59 kg/m , BMI Body mass index is 22.59 kg/m.  Wt Readings from Last 3 Encounters:  04/25/18 162 lb (73.5 kg)  06/14/17 165 lb (74.8 kg)  05/03/17 165 lb (74.8 kg)    General:  Elderly male, appears comfortable at rest. HEENT: Conjunctiva and lids normal, oropharynx clear. Neck: Supple, no elevated JVP or carotid bruits, no thyromegaly. Lungs: Clear to auscultation, nonlabored breathing at rest. Cardiac: Regular rate and rhythm, no S3 or significant systolic murmur, no pericardial rub. Abdomen: Soft, nontender, bowel sounds present. Extremities: No pitting edema, distal pulses 1-2+. Skin: Warm and dry. Musculoskeletal: No kyphosis. Neuropsychiatric: Alert and oriented x3, affect grossly appropriate.  ECG: I personally reviewed the tracing from 04/21/2017 which showed sinus bradycardia with nonspecific T wave  changes.  Recent Labwork: 04/26/2017: BUN 18; Creatinine, Ser 0.87; Hemoglobin 15.7; Platelets 215; Potassium 3.9; Sodium 141   Other Studies Reviewed Today:  Adenosine Cardiolite April 2009: No evidence of ischemia, inferior wall scar, LVEF 53%.   Assessment and Plan:  1.  CAD with history of BMS to the RCA and circumflex in 2009 with residual moderate LAD disease.  He reports good exercise tolerance without angina symptoms on medical therapy and remains comfortable with observation.  We have discussed warning signs and symptoms, at this point no changes were made.  I reviewed his ECG.  2.  Mixed hyperlipidemia, on Zocor.  Keep follow-up with Dr. Sherrie Sport.  3.  History of cardiomyopathy with improvement in LVEF, last assessed in the low normal range.  Current medicines were reviewed with the patient today.   Orders Placed This Encounter  Procedures  . EKG 12-Lead    Disposition: Follow-up in 1 year, sooner if needed.  Signed, Satira Sark, MD, [facility] 04/25/2018 3:39 PM    [facility] [place], [place], [place] [address] Phone: [phone]; Fax: [phone]

## 2018-07-03 ENCOUNTER — Other Ambulatory Visit: Payer: Self-pay | Admitting: Cardiology

## 2018-07-24 ENCOUNTER — Other Ambulatory Visit: Payer: Self-pay | Admitting: Cardiology

## 2019-01-12 DIAGNOSIS — K4091 Unilateral inguinal hernia, without obstruction or gangrene, recurrent: Secondary | ICD-10-CM | POA: Diagnosis not present

## 2019-01-12 DIAGNOSIS — I10 Essential (primary) hypertension: Secondary | ICD-10-CM | POA: Diagnosis not present

## 2019-01-12 DIAGNOSIS — Z6823 Body mass index (BMI) 23.0-23.9, adult: Secondary | ICD-10-CM | POA: Diagnosis not present

## 2019-01-26 DIAGNOSIS — K4090 Unilateral inguinal hernia, without obstruction or gangrene, not specified as recurrent: Secondary | ICD-10-CM | POA: Diagnosis not present

## 2019-01-26 DIAGNOSIS — K5909 Other constipation: Secondary | ICD-10-CM | POA: Diagnosis not present

## 2019-02-09 DIAGNOSIS — Z86718 Personal history of other venous thrombosis and embolism: Secondary | ICD-10-CM | POA: Diagnosis not present

## 2019-02-09 DIAGNOSIS — D176 Benign lipomatous neoplasm of spermatic cord: Secondary | ICD-10-CM | POA: Diagnosis not present

## 2019-02-09 DIAGNOSIS — K5909 Other constipation: Secondary | ICD-10-CM | POA: Diagnosis not present

## 2019-02-09 DIAGNOSIS — I10 Essential (primary) hypertension: Secondary | ICD-10-CM | POA: Diagnosis not present

## 2019-02-09 DIAGNOSIS — E785 Hyperlipidemia, unspecified: Secondary | ICD-10-CM | POA: Diagnosis not present

## 2019-02-09 DIAGNOSIS — Z87891 Personal history of nicotine dependence: Secondary | ICD-10-CM | POA: Diagnosis not present

## 2019-02-09 DIAGNOSIS — K4090 Unilateral inguinal hernia, without obstruction or gangrene, not specified as recurrent: Secondary | ICD-10-CM | POA: Diagnosis not present

## 2019-02-09 DIAGNOSIS — I2510 Atherosclerotic heart disease of native coronary artery without angina pectoris: Secondary | ICD-10-CM | POA: Diagnosis not present

## 2019-02-09 DIAGNOSIS — Z8571 Personal history of Hodgkin lymphoma: Secondary | ICD-10-CM | POA: Diagnosis not present

## 2019-02-09 DIAGNOSIS — R001 Bradycardia, unspecified: Secondary | ICD-10-CM | POA: Diagnosis not present

## 2019-02-09 DIAGNOSIS — I252 Old myocardial infarction: Secondary | ICD-10-CM | POA: Diagnosis not present

## 2019-02-10 ENCOUNTER — Other Ambulatory Visit: Payer: Self-pay | Admitting: Cardiology

## 2019-02-13 DIAGNOSIS — D176 Benign lipomatous neoplasm of spermatic cord: Secondary | ICD-10-CM | POA: Diagnosis not present

## 2019-02-13 DIAGNOSIS — K4090 Unilateral inguinal hernia, without obstruction or gangrene, not specified as recurrent: Secondary | ICD-10-CM | POA: Diagnosis not present

## 2019-02-13 DIAGNOSIS — K5909 Other constipation: Secondary | ICD-10-CM | POA: Diagnosis not present

## 2019-02-13 DIAGNOSIS — E785 Hyperlipidemia, unspecified: Secondary | ICD-10-CM | POA: Diagnosis not present

## 2019-02-13 DIAGNOSIS — I10 Essential (primary) hypertension: Secondary | ICD-10-CM | POA: Diagnosis not present

## 2019-02-13 DIAGNOSIS — I2510 Atherosclerotic heart disease of native coronary artery without angina pectoris: Secondary | ICD-10-CM | POA: Diagnosis not present

## 2019-04-26 ENCOUNTER — Ambulatory Visit: Payer: BLUE CROSS/BLUE SHIELD | Admitting: Cardiology

## 2019-07-03 ENCOUNTER — Other Ambulatory Visit: Payer: Self-pay | Admitting: Cardiology

## 2019-07-21 ENCOUNTER — Encounter: Payer: Self-pay | Admitting: *Deleted

## 2019-07-24 ENCOUNTER — Ambulatory Visit (INDEPENDENT_AMBULATORY_CARE_PROVIDER_SITE_OTHER): Payer: Commercial Managed Care - PPO | Admitting: Cardiology

## 2019-07-24 ENCOUNTER — Encounter: Payer: Self-pay | Admitting: *Deleted

## 2019-07-24 ENCOUNTER — Other Ambulatory Visit: Payer: Self-pay

## 2019-07-24 ENCOUNTER — Encounter: Payer: Self-pay | Admitting: Cardiology

## 2019-07-24 VITALS — BP 134/70 | Temp 97.8°F | Ht 71.0 in | Wt 178.0 lb

## 2019-07-24 DIAGNOSIS — I25119 Atherosclerotic heart disease of native coronary artery with unspecified angina pectoris: Secondary | ICD-10-CM | POA: Diagnosis not present

## 2019-07-24 DIAGNOSIS — E782 Mixed hyperlipidemia: Secondary | ICD-10-CM

## 2019-07-24 MED ORDER — NITROGLYCERIN 0.4 MG SL SUBL: 0.4000 mg | SUBLINGUAL_TABLET | SUBLINGUAL | 3 refills | Status: DC | PRN

## 2019-07-24 NOTE — Addendum Note (Signed)
Addended by: Laurine Blazer on: 07/24/2019 03:40 PM   Modules accepted: Orders

## 2019-07-24 NOTE — Patient Instructions (Signed)

## 2019-07-24 NOTE — Progress Notes (Signed)
Cardiology Office Note  Date: 07/24/2019   ID: Chadric, Danny [name], MRN [id number]  PCP:  Neale Burly, MD  Cardiologist:  Rozann Lesches, MD Electrophysiologist:  None   Chief Complaint  Patient presents with  . Cardiac follow-up    History of Present Illness: Danny [name] is a 78 y.o. male last seen in May 2019.  He presents for a routine visit.  Still does remarkably well, no angina symptoms or nitroglycerin use.  Full-time at [employer] as a Radiation protection practitioner.  When he is at home he does a lot of outdoor activity working on his property.  He reports NYHA class I-II dyspnea, no palpitations or syncope.  He does medications which are listed below.  He reports no intolerances.  We discussed getting a refill for a fresh bottle of nitroglycerin.  He is still following with Dr. Sherrie Sport.  We are requesting his interval lab work.  I personally reviewed his ECG today which shows a sinus bradycardia with nonspecific T wave changes.  Past Medical History:  Diagnosis Date  . Cardiomyopathy ([place])    EF 40-45%, by 2D echo, 2/09  . Coronary atherosclerosis of native coronary artery    BMS RCA and circ 2/09, residual 75% LAD, LVEF 45%  . Deep venous thrombosis (HCC)    Left upper extremity deep venous thrombosis  . Hodgkin's lymphoma ([place]) 2007   Stage IIIB  . Hyperlipidemia   . Myocardial infarction [facility]) 2006   Out of hospital IMI  . Sinus bradycardia     Past Surgical History:  Procedure Laterality Date  . CATARACT EXTRACTION W/PHACO Left 05/03/2017   Procedure: CATARACT EXTRACTION PHACO AND INTRAOCULAR LENS PLACEMENT (IOC);  Surgeon: Tonny Branch, MD;  Location: [facility];  Service: Ophthalmology;  Laterality: Left;  CDE: 13.15  . CATARACT EXTRACTION W/PHACO Right 06/14/2017   Procedure: CATARACT EXTRACTION PHACO AND INTRAOCULAR LENS PLACEMENT (IOC);  Surgeon: Tonny Branch, MD;  Location: [facility];  Service: Ophthalmology;  Laterality: Right;  CDE: 10.58  . CORONARY  ANGIOPLASTY WITH STENT PLACEMENT  2009  . PORT-A-CATH PLACEMENT      Current Outpatient Medications  Medication Sig Dispense Refill  . aspirin EC 81 MG tablet Take 81 mg by mouth daily.      . carvedilol (COREG) 3.125 MG tablet TAKE 1 TABLET BY MOUTH TWICE DAILY 180 tablet 1  . nitroGLYCERIN (NITROSTAT) 0.4 MG SL tablet Place 1 tablet (0.4 mg total) under the tongue every 5 (five) minutes x 3 doses as needed. (Patient taking differently: Place 0.4 mg under the tongue every 5 (five) minutes x 3 doses as needed for chest pain. ) 25 tablet 3  . simvastatin (ZOCOR) 10 MG tablet TAKE 1 TABLET BY MOUTH AT BEDTIME 90 tablet 0   No current facility-administered medications for this visit.    Allergies:  Diphenhydramine hcl   Social History: The patient  reports that he quit smoking about 27 years ago. His smoking use included cigarettes. He has a 30.00 pack-year smoking history. He quit smokeless tobacco use about 13 years ago.  His smokeless tobacco use included chew. He reports that he does not drink alcohol or use drugs.   ROS:  Please see the history of present illness. Otherwise, complete review of systems is positive for none.  All other systems are reviewed and negative.   Physical Exam: VS:  BP 134/70   Temp 97.8 F (36.6 C)   Ht 5\' 11"  (1.803 m)   Wt  178 lb (80.7 kg)   SpO2 96%   BMI 24.83 kg/m , BMI Body mass index is 24.83 kg/m.  Wt Readings from Last 3 Encounters:  07/24/19 178 lb (80.7 kg)  04/25/18 162 lb (73.5 kg)  06/14/17 165 lb (74.8 kg)    General: Elderly male, appears comfortable at rest. HEENT: Conjunctiva and lids normal, wearing a mask. Neck: Supple, no elevated JVP or carotid bruits, no thyromegaly. Lungs: Clear to auscultation, nonlabored breathing at rest. Cardiac: Regular rate and rhythm, no S3 or significant systolic murmur. Abdomen: Soft, nontender, bowel sounds present. Extremities: No pitting edema, distal pulses 2+. Skin: Warm and dry.  Musculoskeletal: No kyphosis. Neuropsychiatric: Alert and oriented x3, affect grossly appropriate.  ECG:  An ECG dated 04/25/2018 was personally reviewed today and demonstrated:  Sinus bradycardia with nonspecific T wave changes.  Recent Labwork:  04/26/2017: BUN 18; Creatinine, Ser 0.87; Hemoglobin 15.7; Platelets 215; Potassium 3.9; Sodium 141   Other Studies Reviewed Today:  Adenosine Cardiolite April 2009: No evidence of ischemia, inferior wall scar, LVEF 53%.  Assessment and Plan:  1.  CAD status post BMS to the RCA and circumflex in 2009 with moderate residual LAD disease.  He continues to do very well without any significant angina symptoms or nitroglycerin use.  He describes good exercise tolerance and we continue with observation.  ECG reviewed.  2.  Mixed hyperlipidemia on Zocor.  Continue to follow with PCP.  Medication Adjustments/Labs and Tests Ordered: Current medicines are reviewed at length with the patient today.  Concerns regarding medicines are outlined above.   Tests Ordered: No orders of the defined types were placed in this encounter.   Medication Changes: No orders of the defined types were placed in this encounter.   Disposition:  Follow up 1 year in the [place] office.  Signed, Satira Sark, MD, [facility] 07/24/2019 3:33 PM    [facility] [place], [place], [place] [address] Phone: [phone]; Fax: [phone]

## 2019-07-25 NOTE — Addendum Note (Signed)
Addended by: Merlene Laughter on: 07/25/2019 08:07 AM   Modules accepted: Orders

## 2019-08-14 ENCOUNTER — Other Ambulatory Visit: Payer: Self-pay | Admitting: Cardiology

## 2019-10-02 ENCOUNTER — Other Ambulatory Visit: Payer: Self-pay | Admitting: Cardiology

## 2019-11-11 ENCOUNTER — Other Ambulatory Visit: Payer: Self-pay | Admitting: Cardiology

## 2020-01-02 ENCOUNTER — Other Ambulatory Visit: Payer: Self-pay | Admitting: Cardiology

## 2020-04-03 ENCOUNTER — Other Ambulatory Visit: Payer: Self-pay | Admitting: Cardiology

## 2020-05-07 ENCOUNTER — Other Ambulatory Visit: Payer: Self-pay | Admitting: Cardiology

## 2020-09-30 ENCOUNTER — Ambulatory Visit (INDEPENDENT_AMBULATORY_CARE_PROVIDER_SITE_OTHER): Payer: Commercial Managed Care - PPO | Admitting: Cardiology

## 2020-09-30 ENCOUNTER — Encounter: Payer: Self-pay | Admitting: Cardiology

## 2020-09-30 VITALS — BP 122/62 | HR 56 | Ht 71.0 in | Wt 162.0 lb

## 2020-09-30 DIAGNOSIS — Z79899 Other long term (current) drug therapy: Secondary | ICD-10-CM

## 2020-09-30 DIAGNOSIS — E782 Mixed hyperlipidemia: Secondary | ICD-10-CM

## 2020-09-30 DIAGNOSIS — I25119 Atherosclerotic heart disease of native coronary artery with unspecified angina pectoris: Secondary | ICD-10-CM

## 2020-09-30 MED ORDER — NITROGLYCERIN 0.4 MG SL SUBL: 0.4000 mg | SUBLINGUAL_TABLET | SUBLINGUAL | 3 refills | Status: DC | PRN

## 2020-09-30 NOTE — Progress Notes (Signed)
Cardiology Office Note  Date: 09/30/2020   ID: Danny [name], DOB 1941/03/18, MRN [id number]  PCP:  Neale Burly, MD  Cardiologist:  Rozann Lesches, MD Electrophysiologist:  None   Chief Complaint  Patient presents with  . Cardiac follow-up    History of Present Illness: Danny [name] is an 79 y.o. male last seen in August 2020.  He presents for a follow-up visit.  Reports no active angina or nitroglycerin use.  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  We went over his medications, discussed getting a refill for fresh bottle of nitroglycerin.  He has not had a recent lipid panel.  I personally reviewed his ECG today which shows a sinus bradycardia with lead motion artifact.   Past Medical History:  Diagnosis Date  . Cardiomyopathy ([place])    EF 40-45%, by 2D echo, 2/09  . Coronary atherosclerosis of native coronary artery    BMS RCA and circ 2/09, residual 75% LAD, LVEF 45%  . Deep venous thrombosis (HCC)    Left upper extremity deep venous thrombosis  . Hodgkin's lymphoma ([place]) 2007   Stage IIIB  . Hyperlipidemia   . Myocardial infarction [facility]) 2006   Out of hospital IMI  . Sinus bradycardia     Past Surgical History:  Procedure Laterality Date  . CATARACT EXTRACTION W/PHACO Left 05/03/2017   Procedure: CATARACT EXTRACTION PHACO AND INTRAOCULAR LENS PLACEMENT (IOC);  Surgeon: Tonny Branch, MD;  Location: [facility];  Service: Ophthalmology;  Laterality: Left;  CDE: 13.15  . CATARACT EXTRACTION W/PHACO Right 06/14/2017   Procedure: CATARACT EXTRACTION PHACO AND INTRAOCULAR LENS PLACEMENT (IOC);  Surgeon: Tonny Branch, MD;  Location: [facility];  Service: Ophthalmology;  Laterality: Right;  CDE: 10.58  . CORONARY ANGIOPLASTY WITH STENT PLACEMENT  2009  . PORT-A-CATH PLACEMENT      Current Outpatient Medications  Medication Sig Dispense Refill  . aspirin EC 81 MG tablet Take 81 mg by mouth daily.      .  carvedilol (COREG) 3.125 MG tablet Take 1 tablet by mouth twice daily 180 tablet 1  . nitroGLYCERIN (NITROSTAT) 0.4 MG SL tablet Place 1 tablet (0.4 mg total) under the tongue every 5 (five) minutes x 3 doses as needed for chest pain (if no relief after 2nd dose, proceed to the ED for an evaluation or call 911). 25 tablet 3  . simvastatin (ZOCOR) 10 MG tablet TAKE 1 TABLET BY MOUTH AT BEDTIME 90 tablet 1   No current facility-administered medications for this visit.   Allergies:  Diphenhydramine hcl   ROS: No palpitations.  Physical Exam: VS:  BP 122/62   Pulse (!) 56   Ht 5\' 11"  (1.803 m)   Wt 162 lb (73.5 kg)   SpO2 96%   BMI 22.59 kg/m , BMI Body mass index is 22.59 kg/m.  Wt Readings from Last 3 Encounters:  09/30/20 162 lb (73.5 kg)  07/24/19 178 lb (80.7 kg)  04/25/18 162 lb (73.5 kg)    General: Elderly male, appears comfortable at rest. HEENT: Conjunctiva and lids normal, wearing a mask. Neck: Supple, no elevated JVP or carotid bruits, no thyromegaly. Lungs: Clear to auscultation, nonlabored breathing at rest. Cardiac: Regular rate and rhythm, no S3 or significant systolic murmur, no pericardial rub. Extremities: No pitting edema, distal pulses 2+.  ECG:  An ECG dated 07/24/2019 was personally reviewed today and demonstrated:  Sinus bradycardia with nonspecific T  wave changes.  Recent Labwork:  No interval lab work for review today.  Other Studies Reviewed Today:  No interval cardiac testing for review today.  Assessment and Plan:  1.  CAD status post BMS to the RCA and circumflex in 2009 with moderate residual LAD disease.  We are following him conservatively on medical therapy in the absence of angina symptoms.  ECG reviewed.  Continue aspirin, Coreg, Zocor, and as needed nitroglycerin.  2.  Mixed hyperlipidemia on Zocor.  Check FLP and LFTs.  Medication Adjustments/Labs and Tests Ordered: Current medicines are reviewed at length with the patient today.   Concerns regarding medicines are outlined above.   Tests Ordered: Orders Placed This Encounter  Procedures  . Lipid panel  . Hepatic function panel  . EKG 12-Lead    Medication Changes: Meds ordered this encounter  Medications  . nitroGLYCERIN (NITROSTAT) 0.4 MG SL tablet    Sig: Place 1 tablet (0.4 mg total) under the tongue every 5 (five) minutes x 3 doses as needed for chest pain (if no relief after 2nd dose, proceed to the ED for an evaluation or call 911).    Dispense:  25 tablet    Refill:  3    Disposition:  Follow up 1 year in the [place] office.  Signed, Satira Sark, MD, [facility] 09/30/2020 3:41 PM    [facility] [place], [place], [place] [address] Phone: [phone]; Fax: [phone]

## 2020-09-30 NOTE — Patient Instructions (Signed)
Medication Instructions:   Your physician recommends that you continue on your current medications as directed. Please refer to the Current Medication list given to you today.  Labwork: Your physician recommends that you return for a FASTING lipid profile: this week to check your FLP & LFT's. Please do not eat or drink for at least 8 hours when you have this done. You may take your medications that morning with a sip of water.  This may be done at [facility] from 8:00 am - 4:00 pm. No appointment is needed.  Testing/Procedures:  None  Follow-Up:  Your physician recommends that you schedule a follow-up appointment in: 1 year. You will receive a reminder letter in the mail in about 10 months reminding you to call and schedule your appointment. If you don't receive this letter, please contact our office.  Any Other Special Instructions Will Be Listed Below (If Applicable).  If you need a refill on your cardiac medications before your next appointment, please call your pharmacy.

## 2020-10-08 ENCOUNTER — Other Ambulatory Visit: Payer: Self-pay | Admitting: Cardiology

## 2020-11-04 ENCOUNTER — Other Ambulatory Visit: Payer: Self-pay | Admitting: Cardiology

## 2020-12-17 DIAGNOSIS — I2510 Atherosclerotic heart disease of native coronary artery without angina pectoris: Secondary | ICD-10-CM | POA: Diagnosis not present

## 2020-12-17 DIAGNOSIS — Z7982 Long term (current) use of aspirin: Secondary | ICD-10-CM | POA: Diagnosis not present

## 2020-12-17 DIAGNOSIS — I10 Essential (primary) hypertension: Secondary | ICD-10-CM | POA: Diagnosis not present

## 2020-12-17 DIAGNOSIS — J948 Other specified pleural conditions: Secondary | ICD-10-CM | POA: Diagnosis not present

## 2020-12-17 DIAGNOSIS — C8190 Hodgkin lymphoma, unspecified, unspecified site: Secondary | ICD-10-CM | POA: Diagnosis not present

## 2020-12-17 DIAGNOSIS — I252 Old myocardial infarction: Secondary | ICD-10-CM | POA: Diagnosis not present

## 2020-12-17 DIAGNOSIS — I509 Heart failure, unspecified: Secondary | ICD-10-CM | POA: Diagnosis not present

## 2020-12-17 DIAGNOSIS — Z955 Presence of coronary angioplasty implant and graft: Secondary | ICD-10-CM | POA: Diagnosis not present

## 2020-12-17 DIAGNOSIS — Z20822 Contact with and (suspected) exposure to covid-19: Secondary | ICD-10-CM | POA: Diagnosis present

## 2020-12-17 DIAGNOSIS — E8809 Other disorders of plasma-protein metabolism, not elsewhere classified: Secondary | ICD-10-CM | POA: Diagnosis not present

## 2020-12-17 DIAGNOSIS — J449 Chronic obstructive pulmonary disease, unspecified: Secondary | ICD-10-CM | POA: Diagnosis present

## 2020-12-17 DIAGNOSIS — C8110 Nodular sclerosis classical Hodgkin lymphoma, unspecified site: Secondary | ICD-10-CM | POA: Diagnosis not present

## 2020-12-17 DIAGNOSIS — E785 Hyperlipidemia, unspecified: Secondary | ICD-10-CM | POA: Diagnosis present

## 2020-12-17 DIAGNOSIS — J90 Pleural effusion, not elsewhere classified: Secondary | ICD-10-CM | POA: Diagnosis not present

## 2020-12-17 DIAGNOSIS — R609 Edema, unspecified: Secondary | ICD-10-CM | POA: Diagnosis not present

## 2020-12-17 DIAGNOSIS — Z87891 Personal history of nicotine dependence: Secondary | ICD-10-CM | POA: Diagnosis not present

## 2020-12-17 DIAGNOSIS — M545 Low back pain, unspecified: Secondary | ICD-10-CM | POA: Diagnosis not present

## 2020-12-18 DIAGNOSIS — I2510 Atherosclerotic heart disease of native coronary artery without angina pectoris: Secondary | ICD-10-CM | POA: Diagnosis not present

## 2020-12-18 DIAGNOSIS — M545 Low back pain, unspecified: Secondary | ICD-10-CM | POA: Diagnosis not present

## 2020-12-18 DIAGNOSIS — C8190 Hodgkin lymphoma, unspecified, unspecified site: Secondary | ICD-10-CM | POA: Diagnosis not present

## 2020-12-18 DIAGNOSIS — I10 Essential (primary) hypertension: Secondary | ICD-10-CM | POA: Diagnosis not present

## 2020-12-18 DIAGNOSIS — J90 Pleural effusion, not elsewhere classified: Secondary | ICD-10-CM | POA: Diagnosis not present

## 2020-12-19 DIAGNOSIS — I10 Essential (primary) hypertension: Secondary | ICD-10-CM | POA: Diagnosis not present

## 2020-12-19 DIAGNOSIS — R609 Edema, unspecified: Secondary | ICD-10-CM | POA: Diagnosis not present

## 2020-12-19 DIAGNOSIS — I2510 Atherosclerotic heart disease of native coronary artery without angina pectoris: Secondary | ICD-10-CM | POA: Diagnosis not present

## 2020-12-19 DIAGNOSIS — C8110 Nodular sclerosis classical Hodgkin lymphoma, unspecified site: Secondary | ICD-10-CM | POA: Diagnosis not present

## 2020-12-19 DIAGNOSIS — J90 Pleural effusion, not elsewhere classified: Secondary | ICD-10-CM | POA: Diagnosis not present

## 2020-12-19 DIAGNOSIS — E8809 Other disorders of plasma-protein metabolism, not elsewhere classified: Secondary | ICD-10-CM | POA: Diagnosis not present

## 2020-12-19 DIAGNOSIS — M545 Low back pain, unspecified: Secondary | ICD-10-CM | POA: Diagnosis not present

## 2020-12-19 DIAGNOSIS — C8190 Hodgkin lymphoma, unspecified, unspecified site: Secondary | ICD-10-CM | POA: Diagnosis not present

## 2020-12-20 DIAGNOSIS — I2510 Atherosclerotic heart disease of native coronary artery without angina pectoris: Secondary | ICD-10-CM | POA: Diagnosis not present

## 2020-12-20 DIAGNOSIS — J90 Pleural effusion, not elsewhere classified: Secondary | ICD-10-CM | POA: Diagnosis not present

## 2020-12-20 DIAGNOSIS — C8190 Hodgkin lymphoma, unspecified, unspecified site: Secondary | ICD-10-CM | POA: Diagnosis not present

## 2020-12-20 DIAGNOSIS — I10 Essential (primary) hypertension: Secondary | ICD-10-CM | POA: Diagnosis not present

## 2020-12-24 DIAGNOSIS — J90 Pleural effusion, not elsewhere classified: Secondary | ICD-10-CM | POA: Diagnosis not present

## 2020-12-24 DIAGNOSIS — J929 Pleural plaque without asbestos: Secondary | ICD-10-CM | POA: Diagnosis not present

## 2020-12-24 DIAGNOSIS — Z6821 Body mass index (BMI) 21.0-21.9, adult: Secondary | ICD-10-CM | POA: Diagnosis not present

## 2020-12-24 DIAGNOSIS — Z7189 Other specified counseling: Secondary | ICD-10-CM | POA: Diagnosis not present

## 2020-12-24 DIAGNOSIS — C450 Mesothelioma of pleura: Secondary | ICD-10-CM | POA: Diagnosis not present

## 2020-12-25 DIAGNOSIS — Z6820 Body mass index (BMI) 20.0-20.9, adult: Secondary | ICD-10-CM | POA: Diagnosis not present

## 2020-12-25 DIAGNOSIS — C801 Malignant (primary) neoplasm, unspecified: Secondary | ICD-10-CM | POA: Diagnosis not present

## 2020-12-25 NOTE — Progress Notes (Addendum)
Cardiology Office Note  Date: 12/26/2020   ID: Danny Danny [name], DOB [birth year], MRN [id number]  PCP:  Danny Deiters, MD  Cardiologist:  Danny Dell, MD Electrophysiologist:  None   Chief Complaint: Pre-op surgical clearance  History of Present Illness: Danny Danny [name] is a 80 y.o. male with a history of CAD, HLD, DVT, Hodgkin's lymphoma, sinus bradycardia.  Last encounter with Danny Danny [name] 09/30/2020.  He reported no active angina or nitroglycerin use.  He was continuing his aspirin, Coreg, Zocor, and as needed nitroglycerin.  Continue conservative medical therapy in Danny absence of anginal symptoms.  Fasting lipid panel and LFTs were ordered.  Recent admission to [name] 12/17/2020 for large left pleural effusion. Had been having left sided CP, DOE, and decreased appetite with 10 lb weight loss over 1 month. CT showed large left pleural effusion with left to right shift of mediastinum . Had thoracentesis with 2L fluid removal. CXR Danny following day showed moderate left pleural effusion concerning for malignant effusion. Danny Danny [name], oncology was consulted and saw Danny Danny [name]. He also had LE edema which resolved with Lasix. Had no more chest pain after thoracentesis. Troponins were unremarkable.  This a pleasant 80 year old gentleman here for preop clearance for lung biopsy.  Recently discovered large left pleural effusion with thoracentesis thought to be malignant in origin.  Had a subsequent thoracentesis with 2 L removed from left lung.  Today he presents without any significant issues.  Denies any chest pain, back pain, or shortness of breath.  O2 sat on room air is 94%.  States he feels much better since his thoracentesis.  He is being followed by Danny. Judeth [name] oncology.  Previous history of Hodgkin's lymphoma.  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  Denies any orthostatic symptoms, CVA or TIA-like symptoms,  palpitations or arrhythmias, PND, orthopnea, bleeding, claudication-like symptoms, DVT or PE-like symptoms.  Does have some mild lower extremity edema.  He takes Lasix 20 mg daily.  Past Medical History:  Diagnosis Date  . Cardiomyopathy (HCC)    EF 40-45%, by 2D echo, 2/09  . Coronary atherosclerosis of native coronary artery    BMS RCA and circ 2/09, residual 75% LAD, LVEF 45%  . Deep venous thrombosis (HCC)    Left upper extremity deep venous thrombosis  . Hodgkin's lymphoma (HCC) 2007   Stage IIIB  . Hyperlipidemia   . Myocardial infarction [name] [place]) 2006   Out of [name] IMI  . Sinus bradycardia     Past Surgical History:  Procedure Laterality Date  . CATARACT EXTRACTION W/PHACO Left 05/03/2017   Procedure: CATARACT EXTRACTION PHACO AND INTRAOCULAR LENS PLACEMENT (IOC);  Surgeon: Danny Payor, MD;  Location: [facility];  Service: Ophthalmology;  Laterality: Left;  CDE: 13.15  . CATARACT EXTRACTION W/PHACO Right 06/14/2017   Procedure: CATARACT EXTRACTION PHACO AND INTRAOCULAR LENS PLACEMENT (IOC);  Surgeon: Danny Payor, MD;  Location: [facility];  Service: Ophthalmology;  Laterality: Right;  CDE: 10.58  . CORONARY ANGIOPLASTY WITH STENT PLACEMENT  2009  . PORT-A-CATH PLACEMENT      Current Outpatient Medications  Medication Sig Dispense Refill  . aspirin EC 81 MG tablet Take 81 mg by mouth daily.    . carvedilol (COREG) 3.125 MG tablet Take 1 tablet by mouth twice daily 180 tablet 1  . furosemide (LASIX) 20 MG tablet Take 20 mg by mouth daily.    . nitroGLYCERIN (NITROSTAT) 0.4 MG SL tablet Place  1 tablet (0.4 mg total) under Danny tongue every 5 (five) minutes x 3 doses as needed for chest pain (if no relief after 2nd dose, proceed to Danny ED for an evaluation or call 911). 25 tablet 3  . ondansetron (ZOFRAN-ODT) 4 MG disintegrating tablet Take 4 mg by mouth every 8 (eight) hours as needed.    . simvastatin (ZOCOR) 10 MG tablet TAKE 1 TABLET BY MOUTH AT BEDTIME 90 tablet 3   No current  facility-administered medications for this visit.   Allergies:  Diphenhydramine hcl   Social History: Danny Danny [name]  reports that he quit smoking about 29 years ago. His smoking use included cigarettes. He has a 30.00 pack-year smoking history. He quit smokeless tobacco use about 15 years ago.  His smokeless tobacco use included chew. He reports that he does not drink alcohol and does not use drugs.   Family History: Danny Danny [name]'s family history includes Coronary artery disease in an other family member.   ROS:  Please see Danny history of present illness. Otherwise, complete review of systems is positive for none.  All other systems are reviewed and negative.   Physical Exam: VS:  BP 132/70   Pulse 73   Resp 16   Ht 5\' 11"  (1.803 m)   Wt 149 lb 3.2 oz (67.7 kg)   SpO2 94%   BMI 20.81 kg/m , BMI Body mass index is 20.81 kg/m.  Wt Readings from Last 3 Encounters:  12/26/20 149 lb 3.2 oz (67.7 kg)  09/30/20 162 lb (73.5 kg)  07/24/19 178 lb (80.7 kg)    General: Danny [name] appears comfortable at rest. Neck: Supple, no elevated JVP or carotid bruits, no thyromegaly. Lungs: Decreased breath sounds in Danny left lung relative to Danny right., nonlabored breathing at rest. Cardiac: Regular rate and rhythm, no S3 or significant systolic murmur, no pericardial rub. Extremities: No pitting edema, distal pulses 2+. Skin: Warm and dry. Musculoskeletal: No kyphosis. Neuropsychiatric: Alert and oriented x3, affect grossly appropriate.  ECG:  Last echo in office on 09/30/2020 demonstrated sinus bradycardia with a rate of 50 bpm.  Recent Labwork: No results found for requested labs within last 8760 hours.     Component Value Date/Time   CHOL  02/11/2008 0540    88        ATP III CLASSIFICATION:  <200     mg/dL   Desirable  200-239  mg/dL   Borderline High  >=240    mg/dL   High   TRIG 75 02/11/2008 0540   HDL 25 (L) 02/11/2008 0540   CHOLHDL 3.5 02/11/2008 0540   VLDL 15 02/11/2008 0540    LDLCALC  02/11/2008 0540    48        Total Cholesterol/HDL:CHD Risk Coronary Heart Disease Risk Table                     Men   Women  1/2 Average Risk   3.4   3.3    Other Studies Reviewed Today:   Assessment and Plan:  1. Preoperative clearance   2. CAD in native artery   3. Pleural effusion on left    1. Preoperative clearance Danny [name] is pending a lung biopsy for suspicion of malignant origin of recent large left pleural effusion.  His [facility] activity status index = 37.45 giving him a functional capacity in METS of 7.34.  His revised cardiac risk index = 2 giving Danny Danny [name] risk of major perioperative cardiac event at  6.6%.  From a cardiac standpoint Danny [name] is cleared to undergo left lung biopsy under general anesthesia from a cardiac standpoint.  May hold aspirin 7 to 10 days prior to surgery and resumed afterward.  2. CAD in native artery  (BMS to RCA and circumflex 2009 with moderate residual LAD disease) 2000.  Denies any recent anginal or exertional symptoms.  Continue aspirin 81 mg daily, nitroglycerin as needed as needed.  Continue carvedilol 3.125 mg p.o. twice daily.  Continue simvastatin 10 mg daily.  3. Pleural effusion on left Recent [name] admission for large left pleural effusion with subsequent thoracentesis.  Pleural effusion thought to be malignant in origin.  Danny [name] sees Danny. Candie Echevaria oncology.  Plans are for lung biopsy in Danny near future.  Medication Adjustments/Labs and Tests Ordered: Current medicines are reviewed at length with Danny Danny [name] today.  Concerns regarding medicines are outlined above.   Disposition: Follow-up with Danny. Domenic Polite or APP 6 months.  Signed, Levell July, NP 12/26/2020 9:08 AM    [facility] [place], [place], [place] [address] Phone: [phone]; Fax: [phone]

## 2020-12-26 ENCOUNTER — Ambulatory Visit (INDEPENDENT_AMBULATORY_CARE_PROVIDER_SITE_OTHER): Payer: Commercial Managed Care - PPO | Admitting: Family Medicine

## 2020-12-26 ENCOUNTER — Encounter: Payer: Self-pay | Admitting: Family Medicine

## 2020-12-26 VITALS — BP 132/70 | HR 73 | Resp 16 | Ht 71.0 in | Wt 149.2 lb

## 2020-12-26 DIAGNOSIS — I2510 Atherosclerotic heart disease of native coronary artery without angina pectoris: Secondary | ICD-10-CM | POA: Diagnosis not present

## 2020-12-26 DIAGNOSIS — J90 Pleural effusion, not elsewhere classified: Secondary | ICD-10-CM

## 2020-12-26 DIAGNOSIS — Z01818 Encounter for other preprocedural examination: Secondary | ICD-10-CM | POA: Diagnosis not present

## 2020-12-26 NOTE — Patient Instructions (Addendum)
Medication Instructions:  Your physician recommends that you continue on your current medications as directed. Please refer to the Current Medication list given to you today.  *If you need a refill on your cardiac medications before your next appointment, please call your pharmacy*   Lab Work: NONE   If you have labs (blood work) drawn today and your tests are completely normal, you will receive your results only by: Marland Kitchen MyChart Message (if you have MyChart) OR . A paper copy in the mail If you have any lab test that is abnormal or we need to change your treatment, we will call you to review the results.   Testing/Procedures: NONE    Follow-Up: At [facility], you and your health needs are our priority.  As part of our continuing mission to provide you with exceptional heart care, we have created designated Provider Care Teams.  These Care Teams include your primary Cardiologist (physician) and Advanced Practice Providers (APPs -  Physician Assistants and Nurse Practitioners) who all work together to provide you with the care you need, when you need it.  We recommend signing up for the patient portal called "MyChart".  Sign up information is provided on this After Visit Summary.  MyChart is used to connect with patients for Virtual Visits (Telemedicine).  Patients are able to view lab/test results, encounter notes, upcoming appointments, etc.  Non-urgent messages can be sent to your provider as well.   To learn more about what you can do with MyChart, go to [email or web address].    Your next appointment:   6 month(s)  The format for your next appointment:   In Person  Provider:   You may see Nona Dell, MD or the following Advanced Practice Provider on your designated Care Team:    Nena Polio, NP    Other Instructions Thank you for choosing [facility]!

## 2020-12-31 DIAGNOSIS — J90 Pleural effusion, not elsewhere classified: Secondary | ICD-10-CM | POA: Diagnosis not present

## 2020-12-31 DIAGNOSIS — Z6821 Body mass index (BMI) 21.0-21.9, adult: Secondary | ICD-10-CM | POA: Diagnosis not present

## 2021-01-03 DIAGNOSIS — I700 Atherosclerosis of aorta: Secondary | ICD-10-CM | POA: Diagnosis not present

## 2021-01-03 DIAGNOSIS — J90 Pleural effusion, not elsewhere classified: Secondary | ICD-10-CM | POA: Diagnosis not present

## 2021-01-03 DIAGNOSIS — J439 Emphysema, unspecified: Secondary | ICD-10-CM | POA: Diagnosis not present

## 2021-01-03 DIAGNOSIS — I2510 Atherosclerotic heart disease of native coronary artery without angina pectoris: Secondary | ICD-10-CM | POA: Diagnosis not present

## 2021-01-07 DIAGNOSIS — J984 Other disorders of lung: Secondary | ICD-10-CM | POA: Diagnosis not present

## 2021-01-07 DIAGNOSIS — Z20822 Contact with and (suspected) exposure to covid-19: Secondary | ICD-10-CM | POA: Diagnosis present

## 2021-01-07 DIAGNOSIS — J95812 Postprocedural air leak: Secondary | ICD-10-CM | POA: Diagnosis not present

## 2021-01-07 DIAGNOSIS — I10 Essential (primary) hypertension: Secondary | ICD-10-CM | POA: Diagnosis present

## 2021-01-07 DIAGNOSIS — R918 Other nonspecific abnormal finding of lung field: Secondary | ICD-10-CM | POA: Diagnosis not present

## 2021-01-07 DIAGNOSIS — R0600 Dyspnea, unspecified: Secondary | ICD-10-CM | POA: Diagnosis not present

## 2021-01-07 DIAGNOSIS — C8190 Hodgkin lymphoma, unspecified, unspecified site: Secondary | ICD-10-CM | POA: Diagnosis present

## 2021-01-07 DIAGNOSIS — E785 Hyperlipidemia, unspecified: Secondary | ICD-10-CM | POA: Diagnosis present

## 2021-01-07 DIAGNOSIS — J939 Pneumothorax, unspecified: Secondary | ICD-10-CM | POA: Diagnosis not present

## 2021-01-07 DIAGNOSIS — J90 Pleural effusion, not elsewhere classified: Secondary | ICD-10-CM | POA: Diagnosis not present

## 2021-01-07 DIAGNOSIS — Z87891 Personal history of nicotine dependence: Secondary | ICD-10-CM | POA: Diagnosis not present

## 2021-01-07 DIAGNOSIS — R079 Chest pain, unspecified: Secondary | ICD-10-CM | POA: Diagnosis not present

## 2021-01-07 DIAGNOSIS — J910 Malignant pleural effusion: Secondary | ICD-10-CM | POA: Diagnosis present

## 2021-01-07 DIAGNOSIS — C384 Malignant neoplasm of pleura: Secondary | ICD-10-CM | POA: Diagnosis present

## 2021-01-09 DIAGNOSIS — R918 Other nonspecific abnormal finding of lung field: Secondary | ICD-10-CM | POA: Diagnosis not present

## 2021-01-10 ENCOUNTER — Telehealth: Payer: Self-pay | Admitting: Cardiology

## 2021-01-10 MED ORDER — NITROGLYCERIN 0.4 MG SL SUBL: 0.4000 mg | SUBLINGUAL_TABLET | SUBLINGUAL | 3 refills | Status: AC | PRN

## 2021-01-10 NOTE — Telephone Encounter (Signed)
    1. Which medications need to be refilled? (please list name of each medication and dose if known)  NITROSTAT 0.4  2. Which pharmacy/location (including street and city if local pharmacy) is medication to be sent to  [place]   3. Do they need a 30 day or 90 day supply?

## 2021-01-15 DIAGNOSIS — J90 Pleural effusion, not elsewhere classified: Secondary | ICD-10-CM | POA: Diagnosis not present

## 2021-01-15 DIAGNOSIS — Z789 Other specified health status: Secondary | ICD-10-CM | POA: Diagnosis not present

## 2021-01-15 DIAGNOSIS — Z8571 Personal history of Hodgkin lymphoma: Secondary | ICD-10-CM | POA: Diagnosis not present

## 2021-01-15 DIAGNOSIS — C450 Mesothelioma of pleura: Secondary | ICD-10-CM | POA: Diagnosis not present

## 2021-01-15 DIAGNOSIS — Z6820 Body mass index (BMI) 20.0-20.9, adult: Secondary | ICD-10-CM | POA: Diagnosis not present

## 2021-01-15 DIAGNOSIS — Z7189 Other specified counseling: Secondary | ICD-10-CM | POA: Diagnosis not present

## 2021-01-16 DIAGNOSIS — C450 Mesothelioma of pleura: Secondary | ICD-10-CM | POA: Diagnosis not present

## 2021-01-16 DIAGNOSIS — J90 Pleural effusion, not elsewhere classified: Secondary | ICD-10-CM | POA: Diagnosis not present

## 2021-01-16 DIAGNOSIS — J9382 Other air leak: Secondary | ICD-10-CM | POA: Diagnosis not present

## 2021-01-16 DIAGNOSIS — F17200 Nicotine dependence, unspecified, uncomplicated: Secondary | ICD-10-CM | POA: Diagnosis not present

## 2021-01-16 DIAGNOSIS — C8190 Hodgkin lymphoma, unspecified, unspecified site: Secondary | ICD-10-CM | POA: Diagnosis not present

## 2021-01-16 DIAGNOSIS — E785 Hyperlipidemia, unspecified: Secondary | ICD-10-CM | POA: Diagnosis not present

## 2021-01-16 DIAGNOSIS — I973 Postprocedural hypertension: Secondary | ICD-10-CM | POA: Diagnosis not present

## 2021-01-17 ENCOUNTER — Telehealth: Payer: Self-pay | Admitting: Family Medicine

## 2021-01-17 NOTE — Telephone Encounter (Signed)
Sent you a note a few minutes ago about making the Lasix as needed for lower extremity edema.  He does not have to take it daily only if he has some lower extremity swelling.  Otherwise do not take it.  Thank you

## 2021-01-17 NOTE — Telephone Encounter (Signed)
Last seen in office on 12/26/2020 for pre-op clearance - no medications were changed at this visit, but will send to provider for clarification.

## 2021-01-17 NOTE — Telephone Encounter (Signed)
Daughter spoke with someone at the office today , should patient continue taking lasix since it is not a problem with his heart?

## 2021-01-17 NOTE — Telephone Encounter (Signed)
Notified daughter Chong Sicilian).  Also, if gain 3 lbs in 24 hrs or 5 lbs in one week.  She verbalized understanding.

## 2021-01-24 DIAGNOSIS — Z01818 Encounter for other preprocedural examination: Secondary | ICD-10-CM | POA: Diagnosis not present

## 2021-01-27 DIAGNOSIS — C459 Mesothelioma, unspecified: Secondary | ICD-10-CM | POA: Diagnosis not present

## 2021-01-27 DIAGNOSIS — Z7982 Long term (current) use of aspirin: Secondary | ICD-10-CM | POA: Diagnosis not present

## 2021-01-27 DIAGNOSIS — Z87891 Personal history of nicotine dependence: Secondary | ICD-10-CM | POA: Diagnosis not present

## 2021-01-27 DIAGNOSIS — I10 Essential (primary) hypertension: Secondary | ICD-10-CM | POA: Diagnosis not present

## 2021-01-27 DIAGNOSIS — Z452 Encounter for adjustment and management of vascular access device: Secondary | ICD-10-CM | POA: Diagnosis not present

## 2021-01-27 DIAGNOSIS — J449 Chronic obstructive pulmonary disease, unspecified: Secondary | ICD-10-CM | POA: Diagnosis not present

## 2021-01-29 DIAGNOSIS — Z1329 Encounter for screening for other suspected endocrine disorder: Secondary | ICD-10-CM | POA: Diagnosis not present

## 2021-01-29 DIAGNOSIS — C450 Mesothelioma of pleura: Secondary | ICD-10-CM | POA: Diagnosis not present

## 2021-01-30 DIAGNOSIS — I10 Essential (primary) hypertension: Secondary | ICD-10-CM | POA: Diagnosis not present

## 2021-01-30 DIAGNOSIS — Z87891 Personal history of nicotine dependence: Secondary | ICD-10-CM | POA: Diagnosis not present

## 2021-01-30 DIAGNOSIS — J90 Pleural effusion, not elsewhere classified: Secondary | ICD-10-CM | POA: Diagnosis not present

## 2021-01-30 DIAGNOSIS — E785 Hyperlipidemia, unspecified: Secondary | ICD-10-CM | POA: Diagnosis not present

## 2021-01-30 DIAGNOSIS — C8590 Non-Hodgkin lymphoma, unspecified, unspecified site: Secondary | ICD-10-CM | POA: Diagnosis not present

## 2021-01-30 DIAGNOSIS — C450 Mesothelioma of pleura: Secondary | ICD-10-CM | POA: Diagnosis not present

## 2021-02-04 DIAGNOSIS — Z5112 Encounter for antineoplastic immunotherapy: Secondary | ICD-10-CM | POA: Diagnosis not present

## 2021-02-04 DIAGNOSIS — Z1329 Encounter for screening for other suspected endocrine disorder: Secondary | ICD-10-CM | POA: Diagnosis not present

## 2021-02-04 DIAGNOSIS — C450 Mesothelioma of pleura: Secondary | ICD-10-CM | POA: Diagnosis not present

## 2021-02-14 DIAGNOSIS — C450 Mesothelioma of pleura: Secondary | ICD-10-CM | POA: Diagnosis not present

## 2021-02-14 DIAGNOSIS — Z1329 Encounter for screening for other suspected endocrine disorder: Secondary | ICD-10-CM | POA: Diagnosis not present

## 2021-03-31 DIAGNOSIS — Z09 Encounter for follow-up examination after completed treatment for conditions other than malignant neoplasm: Secondary | ICD-10-CM | POA: Diagnosis not present

## 2021-03-31 DIAGNOSIS — C450 Mesothelioma of pleura: Secondary | ICD-10-CM | POA: Diagnosis not present

## 2021-03-31 DIAGNOSIS — C8100 Nodular lymphocyte predominant Hodgkin lymphoma, unspecified site: Secondary | ICD-10-CM | POA: Diagnosis not present

## 2021-04-01 DIAGNOSIS — Z5112 Encounter for antineoplastic immunotherapy: Secondary | ICD-10-CM | POA: Diagnosis not present

## 2021-04-01 DIAGNOSIS — Z1329 Encounter for screening for other suspected endocrine disorder: Secondary | ICD-10-CM | POA: Diagnosis not present

## 2021-04-01 DIAGNOSIS — C450 Mesothelioma of pleura: Secondary | ICD-10-CM | POA: Diagnosis not present

## 2021-04-09 DIAGNOSIS — C450 Mesothelioma of pleura: Secondary | ICD-10-CM | POA: Diagnosis not present

## 2021-04-09 DIAGNOSIS — Z955 Presence of coronary angioplasty implant and graft: Secondary | ICD-10-CM | POA: Diagnosis not present

## 2021-04-09 DIAGNOSIS — C8190 Hodgkin lymphoma, unspecified, unspecified site: Secondary | ICD-10-CM | POA: Diagnosis not present

## 2021-04-09 DIAGNOSIS — Z87891 Personal history of nicotine dependence: Secondary | ICD-10-CM | POA: Diagnosis not present

## 2021-04-09 DIAGNOSIS — Z79899 Other long term (current) drug therapy: Secondary | ICD-10-CM | POA: Diagnosis not present

## 2021-04-09 DIAGNOSIS — K921 Melena: Secondary | ICD-10-CM | POA: Diagnosis not present

## 2021-04-09 DIAGNOSIS — I10 Essential (primary) hypertension: Secondary | ICD-10-CM | POA: Diagnosis not present

## 2021-04-09 DIAGNOSIS — Z20822 Contact with and (suspected) exposure to covid-19: Secondary | ICD-10-CM | POA: Diagnosis not present

## 2021-04-09 DIAGNOSIS — I700 Atherosclerosis of aorta: Secondary | ICD-10-CM | POA: Diagnosis not present

## 2021-04-09 DIAGNOSIS — K922 Gastrointestinal hemorrhage, unspecified: Secondary | ICD-10-CM | POA: Diagnosis not present

## 2021-04-09 DIAGNOSIS — J90 Pleural effusion, not elsewhere classified: Secondary | ICD-10-CM | POA: Diagnosis not present

## 2021-04-09 DIAGNOSIS — K219 Gastro-esophageal reflux disease without esophagitis: Secondary | ICD-10-CM | POA: Diagnosis not present

## 2021-04-09 DIAGNOSIS — Z7982 Long term (current) use of aspirin: Secondary | ICD-10-CM | POA: Diagnosis not present

## 2021-04-09 DIAGNOSIS — I2510 Atherosclerotic heart disease of native coronary artery without angina pectoris: Secondary | ICD-10-CM | POA: Diagnosis not present

## 2021-04-09 DIAGNOSIS — J929 Pleural plaque without asbestos: Secondary | ICD-10-CM | POA: Diagnosis not present

## 2021-04-09 DIAGNOSIS — K5900 Constipation, unspecified: Secondary | ICD-10-CM | POA: Diagnosis not present

## 2021-04-09 DIAGNOSIS — I313 Pericardial effusion (noninflammatory): Secondary | ICD-10-CM | POA: Diagnosis not present

## 2021-04-09 DIAGNOSIS — Z8571 Personal history of Hodgkin lymphoma: Secondary | ICD-10-CM | POA: Diagnosis not present

## 2021-04-09 DIAGNOSIS — K5730 Diverticulosis of large intestine without perforation or abscess without bleeding: Secondary | ICD-10-CM | POA: Diagnosis not present

## 2021-04-09 DIAGNOSIS — R262 Difficulty in walking, not elsewhere classified: Secondary | ICD-10-CM | POA: Diagnosis not present

## 2021-04-09 DIAGNOSIS — I252 Old myocardial infarction: Secondary | ICD-10-CM | POA: Diagnosis not present

## 2021-04-09 DIAGNOSIS — N200 Calculus of kidney: Secondary | ICD-10-CM | POA: Diagnosis not present

## 2021-04-09 DIAGNOSIS — C459 Mesothelioma, unspecified: Secondary | ICD-10-CM | POA: Diagnosis not present

## 2021-04-09 DIAGNOSIS — E785 Hyperlipidemia, unspecified: Secondary | ICD-10-CM | POA: Diagnosis not present

## 2021-04-14 DIAGNOSIS — Z7189 Other specified counseling: Secondary | ICD-10-CM | POA: Diagnosis not present

## 2021-04-14 DIAGNOSIS — Z09 Encounter for follow-up examination after completed treatment for conditions other than malignant neoplasm: Secondary | ICD-10-CM | POA: Diagnosis not present

## 2021-04-14 DIAGNOSIS — R64 Cachexia: Secondary | ICD-10-CM | POA: Diagnosis not present

## 2021-04-14 DIAGNOSIS — Z711 Person with feared health complaint in whom no diagnosis is made: Secondary | ICD-10-CM | POA: Diagnosis not present

## 2021-04-14 DIAGNOSIS — C450 Mesothelioma of pleura: Secondary | ICD-10-CM | POA: Diagnosis not present

## 2021-04-14 DIAGNOSIS — R54 Age-related physical debility: Secondary | ICD-10-CM | POA: Diagnosis not present

## 2021-04-28 DIAGNOSIS — C450 Mesothelioma of pleura: Secondary | ICD-10-CM | POA: Diagnosis not present

## 2021-04-28 DIAGNOSIS — Z09 Encounter for follow-up examination after completed treatment for conditions other than malignant neoplasm: Secondary | ICD-10-CM | POA: Diagnosis not present

## 2021-04-28 DIAGNOSIS — C8100 Nodular lymphocyte predominant Hodgkin lymphoma, unspecified site: Secondary | ICD-10-CM | POA: Diagnosis not present

## 2021-04-28 DIAGNOSIS — Z8571 Personal history of Hodgkin lymphoma: Secondary | ICD-10-CM | POA: Diagnosis not present

## 2021-04-28 DIAGNOSIS — Z1329 Encounter for screening for other suspected endocrine disorder: Secondary | ICD-10-CM | POA: Diagnosis not present

## 2021-04-29 DIAGNOSIS — Z5112 Encounter for antineoplastic immunotherapy: Secondary | ICD-10-CM | POA: Diagnosis not present

## 2021-04-29 DIAGNOSIS — Z95828 Presence of other vascular implants and grafts: Secondary | ICD-10-CM | POA: Diagnosis not present

## 2021-04-29 DIAGNOSIS — Z1329 Encounter for screening for other suspected endocrine disorder: Secondary | ICD-10-CM | POA: Diagnosis not present

## 2021-04-29 DIAGNOSIS — C450 Mesothelioma of pleura: Secondary | ICD-10-CM | POA: Diagnosis not present

## 2021-05-01 DIAGNOSIS — Z515 Encounter for palliative care: Secondary | ICD-10-CM | POA: Diagnosis not present

## 2021-05-01 DIAGNOSIS — C450 Mesothelioma of pleura: Secondary | ICD-10-CM | POA: Diagnosis not present

## 2021-05-01 DIAGNOSIS — E46 Unspecified protein-calorie malnutrition: Secondary | ICD-10-CM | POA: Diagnosis not present

## 2021-05-06 ENCOUNTER — Other Ambulatory Visit: Payer: Self-pay | Admitting: Cardiology

## 2021-05-06 DIAGNOSIS — I700 Atherosclerosis of aorta: Secondary | ICD-10-CM | POA: Diagnosis not present

## 2021-05-06 DIAGNOSIS — R932 Abnormal findings on diagnostic imaging of liver and biliary tract: Secondary | ICD-10-CM | POA: Diagnosis not present

## 2021-05-06 DIAGNOSIS — I2510 Atherosclerotic heart disease of native coronary artery without angina pectoris: Secondary | ICD-10-CM | POA: Diagnosis not present

## 2021-05-06 DIAGNOSIS — C8190 Hodgkin lymphoma, unspecified, unspecified site: Secondary | ICD-10-CM | POA: Diagnosis not present

## 2021-05-06 DIAGNOSIS — M40204 Unspecified kyphosis, thoracic region: Secondary | ICD-10-CM | POA: Diagnosis not present

## 2021-05-06 DIAGNOSIS — N200 Calculus of kidney: Secondary | ICD-10-CM | POA: Diagnosis not present

## 2021-05-06 DIAGNOSIS — Z77090 Contact with and (suspected) exposure to asbestos: Secondary | ICD-10-CM | POA: Diagnosis not present

## 2021-05-06 DIAGNOSIS — C450 Mesothelioma of pleura: Secondary | ICD-10-CM | POA: Diagnosis not present

## 2021-05-12 DIAGNOSIS — C450 Mesothelioma of pleura: Secondary | ICD-10-CM | POA: Diagnosis not present

## 2021-05-12 DIAGNOSIS — Z681 Body mass index (BMI) 19 or less, adult: Secondary | ICD-10-CM | POA: Diagnosis not present

## 2021-05-12 DIAGNOSIS — Z711 Person with feared health complaint in whom no diagnosis is made: Secondary | ICD-10-CM | POA: Diagnosis not present

## 2021-05-12 DIAGNOSIS — Z09 Encounter for follow-up examination after completed treatment for conditions other than malignant neoplasm: Secondary | ICD-10-CM | POA: Diagnosis not present

## 2021-05-12 DIAGNOSIS — J929 Pleural plaque without asbestos: Secondary | ICD-10-CM | POA: Diagnosis not present

## 2021-05-12 DIAGNOSIS — R54 Age-related physical debility: Secondary | ICD-10-CM | POA: Diagnosis not present

## 2021-05-12 DIAGNOSIS — R64 Cachexia: Secondary | ICD-10-CM | POA: Diagnosis not present

## 2021-05-12 DIAGNOSIS — Z7189 Other specified counseling: Secondary | ICD-10-CM | POA: Diagnosis not present

## 2021-11-02 ENCOUNTER — Other Ambulatory Visit: Payer: Self-pay | Admitting: Cardiology

## 2022-01-21 DEATH — deceased
